# Patient Record
Sex: Female | Born: 1960 | Race: White | Hispanic: No | Marital: Single | Smoking: Never smoker
Health system: Southern US, Community
[De-identification: ages and names within clinical notes are randomized; demographics above are authoritative.]

## PROBLEM LIST (undated history)

## (undated) DIAGNOSIS — C539 Malignant neoplasm of cervix uteri, unspecified: Secondary | ICD-10-CM

## (undated) DIAGNOSIS — R5383 Other fatigue: Secondary | ICD-10-CM

## (undated) DIAGNOSIS — Z86718 Personal history of other venous thrombosis and embolism: Secondary | ICD-10-CM

## (undated) DIAGNOSIS — D649 Anemia, unspecified: Secondary | ICD-10-CM

## (undated) DIAGNOSIS — R896 Abnormal cytological findings in specimens from other organs, systems and tissues: Secondary | ICD-10-CM

## (undated) DIAGNOSIS — D6851 Activated protein C resistance: Secondary | ICD-10-CM

## (undated) DIAGNOSIS — T4145XA Adverse effect of unspecified anesthetic, initial encounter: Secondary | ICD-10-CM

## (undated) DIAGNOSIS — T8859XA Other complications of anesthesia, initial encounter: Secondary | ICD-10-CM

## (undated) DIAGNOSIS — E039 Hypothyroidism, unspecified: Secondary | ICD-10-CM

## (undated) DIAGNOSIS — Z86711 Personal history of pulmonary embolism: Secondary | ICD-10-CM

## (undated) DIAGNOSIS — N83209 Unspecified ovarian cyst, unspecified side: Secondary | ICD-10-CM

## (undated) DIAGNOSIS — Z22322 Carrier or suspected carrier of Methicillin resistant Staphylococcus aureus: Secondary | ICD-10-CM

## (undated) DIAGNOSIS — IMO0001 Reserved for inherently not codable concepts without codable children: Secondary | ICD-10-CM

## (undated) DIAGNOSIS — F419 Anxiety disorder, unspecified: Secondary | ICD-10-CM

## (undated) DIAGNOSIS — Z95828 Presence of other vascular implants and grafts: Secondary | ICD-10-CM

## (undated) DIAGNOSIS — Z923 Personal history of irradiation: Secondary | ICD-10-CM

## (undated) HISTORY — DX: Activated protein C resistance: D68.51

## (undated) HISTORY — DX: Abnormal cytological findings in specimens from other organs, systems and tissues: R89.6

## (undated) HISTORY — DX: Personal history of irradiation: Z92.3

## (undated) HISTORY — DX: Personal history of other venous thrombosis and embolism: Z86.718

## (undated) HISTORY — DX: Hypothyroidism, unspecified: E03.9

## (undated) HISTORY — DX: Reserved for inherently not codable concepts without codable children: IMO0001

## (undated) HISTORY — DX: Personal history of pulmonary embolism: Z86.711

## (undated) HISTORY — DX: Anxiety disorder, unspecified: F41.9

## (undated) HISTORY — DX: Unspecified ovarian cyst, unspecified side: N83.209

---

## 1998-07-15 ENCOUNTER — Inpatient Hospital Stay (HOSPITAL_COMMUNITY): Admission: AD | Admit: 1998-07-15 | Discharge: 1998-07-18 | Payer: Self-pay | Admitting: Psychiatry

## 2000-11-19 ENCOUNTER — Other Ambulatory Visit: Admission: RE | Admit: 2000-11-19 | Discharge: 2000-11-19 | Payer: Self-pay | Admitting: Family Medicine

## 2002-05-26 ENCOUNTER — Other Ambulatory Visit: Admission: RE | Admit: 2002-05-26 | Discharge: 2002-05-26 | Payer: Self-pay | Admitting: Family Medicine

## 2004-04-16 ENCOUNTER — Ambulatory Visit: Payer: Self-pay | Admitting: Family Medicine

## 2004-07-01 ENCOUNTER — Ambulatory Visit: Payer: Self-pay | Admitting: Family Medicine

## 2004-08-07 ENCOUNTER — Ambulatory Visit: Payer: Self-pay | Admitting: Family Medicine

## 2005-01-05 ENCOUNTER — Ambulatory Visit: Payer: Self-pay | Admitting: Family Medicine

## 2005-01-07 ENCOUNTER — Ambulatory Visit: Payer: Self-pay | Admitting: Family Medicine

## 2005-03-24 ENCOUNTER — Ambulatory Visit: Payer: Self-pay | Admitting: Family Medicine

## 2005-03-31 ENCOUNTER — Ambulatory Visit: Payer: Self-pay | Admitting: Family Medicine

## 2005-06-05 ENCOUNTER — Ambulatory Visit: Payer: Self-pay | Admitting: Family Medicine

## 2005-06-10 ENCOUNTER — Ambulatory Visit: Payer: Self-pay | Admitting: Family Medicine

## 2006-03-26 ENCOUNTER — Emergency Department (HOSPITAL_COMMUNITY): Admission: EM | Admit: 2006-03-26 | Discharge: 2006-03-26 | Payer: Self-pay | Admitting: Emergency Medicine

## 2007-04-28 HISTORY — PX: CERVICAL CONIZATION W/BX: SHX1330

## 2007-07-07 ENCOUNTER — Emergency Department (HOSPITAL_COMMUNITY): Admission: EM | Admit: 2007-07-07 | Discharge: 2007-07-07 | Payer: Self-pay | Admitting: Emergency Medicine

## 2009-10-30 ENCOUNTER — Inpatient Hospital Stay (HOSPITAL_COMMUNITY): Admission: AD | Admit: 2009-10-30 | Discharge: 2009-10-30 | Payer: Self-pay | Admitting: Obstetrics & Gynecology

## 2009-10-30 ENCOUNTER — Ambulatory Visit: Payer: Self-pay | Admitting: Obstetrics and Gynecology

## 2009-11-27 ENCOUNTER — Ambulatory Visit: Payer: Self-pay | Admitting: Obstetrics and Gynecology

## 2009-11-27 ENCOUNTER — Encounter: Payer: Self-pay | Admitting: Obstetrics and Gynecology

## 2009-11-27 LAB — CONVERTED CEMR LAB
Free T4: 0.97 ng/dL (ref 0.80–1.80)
Pap Smear: UNDETERMINED
T3, Free: 2.2 pg/mL — ABNORMAL LOW (ref 2.3–4.2)
TSH: 1.578 microintl units/mL (ref 0.350–4.500)

## 2010-01-23 ENCOUNTER — Ambulatory Visit: Payer: Self-pay | Admitting: Obstetrics and Gynecology

## 2010-01-23 ENCOUNTER — Ambulatory Visit (HOSPITAL_COMMUNITY): Admission: RE | Admit: 2010-01-23 | Discharge: 2010-01-23 | Payer: Self-pay | Admitting: Anesthesiology

## 2010-01-23 ENCOUNTER — Other Ambulatory Visit: Admission: RE | Admit: 2010-01-23 | Discharge: 2010-01-23 | Payer: Self-pay | Admitting: Obstetrics and Gynecology

## 2010-07-13 LAB — CBC
MCHC: 35.1 g/dL (ref 30.0–36.0)
WBC: 4.9 10*3/uL (ref 4.0–10.5)

## 2010-07-13 LAB — COMPREHENSIVE METABOLIC PANEL
ALT: 13 U/L (ref 0–35)
AST: 15 U/L (ref 0–37)
Albumin: 4 g/dL (ref 3.5–5.2)
Chloride: 103 mEq/L (ref 96–112)
Creatinine, Ser: 0.74 mg/dL (ref 0.4–1.2)
GFR calc Af Amer: >60 mL/min (ref >60)
Sodium: 135 mEq/L (ref 135–145)
Total Bilirubin: 0.6 mg/dL (ref 0.3–1.2)

## 2010-07-13 LAB — URINALYSIS, ROUTINE W REFLEX MICROSCOPIC
Bilirubin Urine: NEGATIVE
Hgb urine dipstick: NEGATIVE
Ketones, ur: NEGATIVE mg/dL
Protein, ur: NEGATIVE mg/dL

## 2010-07-13 LAB — GC/CHLAMYDIA PROBE AMP, GENITAL: Chlamydia, DNA Probe: NEGATIVE

## 2010-07-13 LAB — WET PREP, GENITAL

## 2010-07-24 ENCOUNTER — Other Ambulatory Visit: Payer: Self-pay | Admitting: Family Medicine

## 2010-07-24 ENCOUNTER — Other Ambulatory Visit: Payer: Self-pay | Admitting: Obstetrics and Gynecology

## 2010-07-24 ENCOUNTER — Ambulatory Visit: Payer: Self-pay | Admitting: Obstetrics and Gynecology

## 2010-07-24 DIAGNOSIS — F449 Dissociative and conversion disorder, unspecified: Secondary | ICD-10-CM

## 2010-07-24 DIAGNOSIS — N949 Unspecified condition associated with female genital organs and menstrual cycle: Secondary | ICD-10-CM

## 2010-07-24 DIAGNOSIS — R300 Dysuria: Secondary | ICD-10-CM

## 2010-07-24 DIAGNOSIS — R102 Pelvic and perineal pain: Secondary | ICD-10-CM

## 2010-07-24 DIAGNOSIS — N83209 Unspecified ovarian cyst, unspecified side: Secondary | ICD-10-CM

## 2010-07-24 LAB — POCT URINALYSIS DIP (DEVICE)
Bilirubin Urine: NEGATIVE
Ketones, ur: NEGATIVE mg/dL
Protein, ur: NEGATIVE mg/dL
Urobilinogen, UA: 0.2 mg/dL (ref 0.0–1.0)
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
## 2010-07-31 ENCOUNTER — Ambulatory Visit (HOSPITAL_COMMUNITY): Payer: Self-pay

## 2010-08-03 ENCOUNTER — Inpatient Hospital Stay (HOSPITAL_COMMUNITY)
Admission: AD | Admit: 2010-08-03 | Discharge: 2010-08-03 | Disposition: A | Discharge status: Home / Self Care | Payer: Self-pay | Source: Ambulatory Visit | Attending: Obstetrics & Gynecology | Admitting: Obstetrics & Gynecology

## 2010-08-03 DIAGNOSIS — R300 Dysuria: Secondary | ICD-10-CM | POA: Insufficient documentation

## 2010-08-03 DIAGNOSIS — R350 Frequency of micturition: Secondary | ICD-10-CM | POA: Insufficient documentation

## 2010-08-03 DIAGNOSIS — O211 Hyperemesis gravidarum with metabolic disturbance: Secondary | ICD-10-CM

## 2010-08-03 LAB — DIFFERENTIAL
Basophils Relative: 0 % (ref 0–1)
Monocytes Absolute: 0.6 10*3/uL (ref 0.1–1.0)
Monocytes Relative: 14 % — ABNORMAL HIGH (ref 3–12)
Neutro Abs: 2.2 10*3/uL (ref 1.7–7.7)

## 2010-08-03 LAB — URINALYSIS, ROUTINE W REFLEX MICROSCOPIC
Glucose, UA: NEGATIVE mg/dL
Hgb urine dipstick: NEGATIVE
Ketones, ur: 15 mg/dL — AB
pH: 6 (ref 5.0–8.0)

## 2010-08-03 LAB — URINE MICROSCOPIC-ADD ON

## 2010-08-03 LAB — CBC
HCT: 38.3 % (ref 36.0–46.0)
MCH: 31.8 pg (ref 26.0–34.0)
MCHC: 35.2 g/dL (ref 30.0–36.0)
Platelets: 144 10*3/uL — ABNORMAL LOW (ref 150–400)
RBC: 4.25 MIL/uL (ref 3.87–5.11)
RDW: 11.6 % (ref 11.5–15.5)
WBC: 4.3 10*3/uL (ref 4.0–10.5)

## 2010-08-05 LAB — URINE CULTURE

## 2010-08-06 ENCOUNTER — Ambulatory Visit (HOSPITAL_COMMUNITY)
Admission: RE | Admit: 2010-08-06 | Discharge: 2010-08-06 | Disposition: A | Discharge status: Home / Self Care | Payer: Self-pay | Source: Ambulatory Visit | Attending: Family Medicine | Admitting: Family Medicine

## 2010-08-06 DIAGNOSIS — N83209 Unspecified ovarian cyst, unspecified side: Secondary | ICD-10-CM

## 2010-08-06 DIAGNOSIS — R102 Pelvic and perineal pain: Secondary | ICD-10-CM

## 2010-08-06 DIAGNOSIS — N949 Unspecified condition associated with female genital organs and menstrual cycle: Secondary | ICD-10-CM | POA: Insufficient documentation

## 2010-08-06 DIAGNOSIS — D252 Subserosal leiomyoma of uterus: Secondary | ICD-10-CM | POA: Insufficient documentation

## 2010-08-07 ENCOUNTER — Ambulatory Visit: Payer: Self-pay | Admitting: Obstetrics and Gynecology

## 2010-08-21 ENCOUNTER — Ambulatory Visit: Payer: Self-pay | Admitting: Obstetrics and Gynecology

## 2010-08-27 ENCOUNTER — Ambulatory Visit: Payer: Self-pay | Admitting: Obstetrics & Gynecology

## 2010-09-09 NOTE — Assessment & Plan Note (Signed)
NAMECRUZITA, LIPA NO.:  [id number]   MEDICAL RECORD NO.:  [id number]          PATIENT TYPE:  WOC   LOCATION:  [facility]         FACILITY:  [facility]   PHYSICIAN:  Caren Griffins, CNM       DATE OF BIRTH:  [birth year]   DATE OF SERVICE:  11/27/2009                                  CLINIC NOTE   REASON FOR VISIT:  Referral from [facility] due to abdominal pain, amenorrhea,  and history of abnormal Paps.   HISTORY OF PRESENT ILLNESS:  This is a 50 year old nulliparous Caucasian  female who was seen at [facility] on October 30, 2009, by me.  On that day,  she was concerned with having 3 months of amenorrhea after having always  regular monthly cycles, was also having constipation at that time and in  addition was having a several-day history of right lower quadrant  abdominal pain.  Since that visit, she did have spontaneous onset of  menses on October 27, 2009, and had a normal flow and she did do the  treatments for constipation and has had normal bowel movements.  Today,  she denies any abdominal pain; however, she does report a history of  urinary frequency and nocturia that has been going on for a long time.  Her chief concern is that she states she had severe dysplasia on her  last colposcopy, cervical biopsy that she says was done May 08, 2009, with Dr. Senaida Ores in [place].  She can no longer go there due  to change in insurance status.  She is also giving a history of having  had a cervical conization for abnormal cytology and states that  following that she had normal Pap smears x3.  She is not sexually active  and has no abnormal bleeding.   ALLERGIES:  None.   MEDICATIONS:  Paxil, clonazepam, Synthroid, folic acid, Metamucil, and  ibuprofen.   HEALTH CARE MAINTENANCE:  She has had the usual immunizations and she  did have a normal mammogram 2 years ago, a colonoscopy 5 years ago that  was normal, and she has regular dental care.   MENSTRUAL HISTORY:   12 x 28 x 3-4 days, moderate flow, and mild  dysmenorrhea.  She states that she has had spotting in between periods  in the past but not recently and of note, as above she did have 3 months  of amenorrhea prior to her normal menses October 27, 2009.  Contraception,  she is not to be on any estrogens and was taken off pills when she had  her pulmonary embolism.  She is not in a relationship at present and  does not request birth control; however, she did have sexual relations 4  months ago.   OBSTETRICAL HISTORY:  Nulliparous.   GYNECOLOGIC HISTORY:  Abnormal Paps as above.  She states she has had a  cone procedure and she thinks a LEEP procedure.  Most recent evaluation  was May 08, 2009, and she states colposcopy showed severe dysplasia.  She does have known fibroids.  She denies any history of STDs.   PAST MEDICAL HISTORY:  Significant for panic  attacks which are  controlled on Paxil, hypothyroidism stating she had a goiter treated in  the past and has not had her thyroid function test done in a long time.  She states she had 3 pulmonary emboli in the past and was hospitalized  for 1 week and states that she has bad genes from both mother and father  that contributes to her blood clotting.   SURGICAL HISTORY:  She had never had transfusion.  Her surgeries include  shoulder cyst removal, cervical conization, exploratory laparoscopy  which was done for dysmenorrhea.  She states she had scar tissue and her  ovary was adherent to intestines.   SOCIAL HISTORY:  She works as a Child psychotherapist, lives alone, nonsmoker.  Does  not drink alcohol.  Denies any illicit drug use or history of being  abused.  She has had 1 sexual partner in the past year.   FAMILY HISTORY:  Father, grandmother, and brother with diabetes.  Father  with heart disease.  Mother and father with hypertension.  No history of  breast, colon, ovarian, or uterine cancer.  Mother had blood clots in  legs and stroke.   REVIEW  OF SYSTEMS:  Positive for intermittent abdominal pain but she is  not having pain today after having had a bowel movement and as noted  above, she does have frequent urination but no urinary incontinence or  dysuria, never had gross hematuria.  She denies any irritative vaginal  discharge or dysmenorrhea.   OBJECTIVE:  VITAL SIGNS:  97.3, 91, 108/77, weight 151.9, and height 6  feet 5 inches.  GENERAL:  WN/WD, pleasant in NAD.  HEENT:  Normocephalic.  Good dentition.  NECK:  Thyroid is slightly enlarged and smooth.  No discrete mass.  HEART:  RRR without murmur.  LUNGS:  CTA bilateral.  BREASTS:  Pendulous.  No discrete masses appreciated.  They appear  symmetric.  There is no nipple discharge or skin lesions.  No  lymphadenopathy noted.  ABDOMEN:  Soft, essentially nontender.  EXTREMITIES:  Legs pulses full and equal.  No dependent edema.  PELVIC:  NEFG.  Good tone and support.  Vagina rugated.  Cervix  posterior.  There is a small nodule on the anterior lip but it appears  otherwise clean.  Bimanual posterior and short consistent with having  had conization.  Uterus is normal to upper limits of normal size, normal  contour and mobile.  No adnexal tenderness or masses appreciated.   LABORATORY DATA:  She had a normal CMP, wet prep, CBC, and urinalysis as  well as a negative pregnancy test at her visit in [facility] on October 30, 2009.  She had an ultrasound same day which revealed a tiny right ovarian cyst  and a 3.9 cm soft tissue mass adjacent to anterior aspect of the uterus  which was thought to be an exophytic uterine fibroid.   ASSESSMENT:  1. History of abnormal cervical cytology.  2. Hypothyroidism.  3. History of pulmonary embolus.  4. Chronic anxiety.   PLAN:  Labs today will be thyroid function tests and Pap smear is done.  In addition, we will send a release of information for Dr. Berenda Morale  records as she tells [place] that he has information about her medical  illnesses as  well as her abnormal cervical cytology.  Followup will be  in 1 month.           ______________________________  Caren Griffins, CNM     DP/MEDQ  D:  11/27/2009  T:  11/28/2009  Job:  [id number]

## 2010-09-10 ENCOUNTER — Ambulatory Visit: Payer: Self-pay | Admitting: Physician Assistant

## 2010-09-27 ENCOUNTER — Emergency Department (HOSPITAL_COMMUNITY)
Admission: EM | Admit: 2010-09-27 | Discharge: 2010-09-27 | Discharge status: Against Medical Advice | Payer: Self-pay | Attending: Emergency Medicine | Admitting: Emergency Medicine

## 2010-09-27 DIAGNOSIS — R4589 Other symptoms and signs involving emotional state: Secondary | ICD-10-CM | POA: Insufficient documentation

## 2010-09-27 DIAGNOSIS — R079 Chest pain, unspecified: Secondary | ICD-10-CM | POA: Insufficient documentation

## 2010-10-24 ENCOUNTER — Ambulatory Visit: Payer: Self-pay | Admitting: Obstetrics & Gynecology

## 2010-11-26 ENCOUNTER — Encounter: Payer: Self-pay | Admitting: *Deleted

## 2010-11-26 DIAGNOSIS — F419 Anxiety disorder, unspecified: Secondary | ICD-10-CM

## 2010-11-26 DIAGNOSIS — D219 Benign neoplasm of connective and other soft tissue, unspecified: Secondary | ICD-10-CM

## 2010-11-26 DIAGNOSIS — Z86711 Personal history of pulmonary embolism: Secondary | ICD-10-CM | POA: Insufficient documentation

## 2010-11-26 DIAGNOSIS — E039 Hypothyroidism, unspecified: Secondary | ICD-10-CM | POA: Insufficient documentation

## 2010-12-10 ENCOUNTER — Ambulatory Visit: Payer: Self-pay | Admitting: Obstetrics & Gynecology

## 2011-01-19 LAB — POCT I-STAT CREATININE
Creatinine, Ser: 0.9
Operator id: 285841

## 2011-01-19 LAB — DIFFERENTIAL
Basophils Absolute: 0
Lymphocytes Relative: 41
Lymphs Abs: 1.8
Monocytes Relative: 8
Neutro Abs: 2.2
Neutrophils Relative %: 49

## 2011-01-19 LAB — I-STAT 8, (EC8 V) (CONVERTED LAB)
BUN: 9
Chloride: 105
Hemoglobin: 13.9
Operator id: 285841
pCO2, Ven: 39.6 — ABNORMAL LOW

## 2011-01-19 LAB — POCT CARDIAC MARKERS
CKMB, poc: <1 — ABNORMAL LOW
Myoglobin, poc: 76.5
Operator id: 285841
Troponin i, poc: <0.05

## 2011-01-19 LAB — D-DIMER, QUANTITATIVE: D-Dimer, Quant: 1.11 — ABNORMAL HIGH

## 2011-01-19 LAB — CBC
MCV: 89.2
Platelets: 228
RDW: 12.7
WBC: 4.5

## 2011-03-13 ENCOUNTER — Ambulatory Visit: Payer: Self-pay | Admitting: Obstetrics & Gynecology

## 2013-03-17 HISTORY — PX: CERVICAL BIOPSY: SHX590

## 2013-03-28 ENCOUNTER — Other Ambulatory Visit: Payer: Self-pay | Admitting: Gynecologic Oncology

## 2013-04-05 ENCOUNTER — Encounter: Payer: Self-pay | Admitting: Gynecologic Oncology

## 2013-04-06 ENCOUNTER — Encounter: Payer: Self-pay | Admitting: Gynecologic Oncology

## 2013-04-06 ENCOUNTER — Encounter (INDEPENDENT_AMBULATORY_CARE_PROVIDER_SITE_OTHER): Payer: Self-pay

## 2013-04-06 ENCOUNTER — Ambulatory Visit: Payer: Managed Care, Other (non HMO) | Attending: Gynecologic Oncology | Admitting: Gynecologic Oncology

## 2013-04-06 VITALS — BP 111/62 | HR 92 | Temp 98.5°F | Resp 16 | Ht 64.17 in | Wt 139.2 lb

## 2013-04-06 DIAGNOSIS — N888 Other specified noninflammatory disorders of cervix uteri: Secondary | ICD-10-CM

## 2013-04-06 DIAGNOSIS — C539 Malignant neoplasm of cervix uteri, unspecified: Secondary | ICD-10-CM | POA: Insufficient documentation

## 2013-04-06 NOTE — Addendum Note (Signed)
Addended by: Warner Mccreedy D on: 04/06/2013 04:00 PM   Modules accepted: Orders

## 2013-04-06 NOTE — Progress Notes (Signed)
Consult Note: Gyn-Onc  Consult was requested by Dr. Senaida Ores for the evaluation of Tammie Marsh 52 y.o. female  CC:  Chief Complaint  Patient presents with  . Cervical mass    New Consult    Assessment/Plan:  Tammie Marsh  is a 52 y.o.  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 A biopsy was collected in 03/18/2011 at [facility] indicated at least severe dysplasia squamous cell carcinoma in situ. Review of the specimen at [facility] states that this specimen is suspicious for invasive squamous cell carcinoma. On physical examination this is a leasing 7.5 cm cervix with extension to bilateral parametrial. The cervix appears barrel shaped and several additional biopsies were collected.  I hope the specimens can  definitively represent invasive squamous cell cancer.  If not consideration will have to be given for a large excision of the lesion with pathology present.  The CT scan demonstrates possible left iliac lymph node involvement. A PET scan has been ordered to more clearly assess the extent of disease.  We will send a urine specimen for cytology. Referrals been made to Dr. Janyth Contes and Dr. Darrold Span  for definitive chemoradiation therapy treatment.  The patient's questions were answered to her satisfaction.   Thank you for the referral.   HPI: Tammie Marsh  is a 52 y.o. nulliparous female last normal menstrual period approximately 2 years ago. She reports over the last year she's had severe abdominal cramping with intermittent episodes of heavy bleeding. She presented to emergency room on 03/13/2013 with heavy vaginal bleeding. A CT scan of the abdomen and pelvis was notable for a mass expanding the cervix and lower uterine segment measuring 7.9 x 4.7 cm x 3 cm a solid right adnexal mass measuring 5.5 cm in size was also noted this was thought to reflect a pedunculated fibroid. There is a single enhancing left  external iliac lymph node measuring 14 mm in the short axis. Patient was taken to the operating room on 03/17/2013 at which time there was a biopsy performed of the cervical mass and or aborted hysteroscopy D&C. Pathology was consistent with squamous mucosa with high-grade squamous dysplasia suspicious for invasive squamous cell carcinoma.  Tammie Marsh history is notable for a long history of abnormal Pap tests and previous treatments with cryotherapy and cold knife cone biopsy. The last cervical excision was in 2009. She states that her last abnormal Pap test was 2 years ago.  Tammie Marsh reports poor appetite she denies nausea or vomiting.  She reports lower abdominal crampy pain managed with hydrocodone. She also reports intermittent diarrhea and no change in her weight.  Current Meds:  Outpatient Encounter Prescriptions as of 04/06/2013  Medication Sig  . Biotin 10 MG TABS Take by mouth.  . cholecalciferol (VITAMIN D) 1000 UNITS tablet Take 1,000 Units by mouth daily.  . clonazePAM (KLONOPIN) 2 MG tablet Take 2 mg by mouth 2 (two) times daily.   . cyanocobalamin 1000 MCG tablet Take 100 mcg by mouth daily.  Marland Kitchen HYDROcodone-acetaminophen (NORCO/VICODIN) 5-325 MG per tablet Take 1 tablet by mouth every 6 (six) hours as needed for moderate pain.  Marland Kitchen ibuprofen (ADVIL,MOTRIN) 800 MG tablet   . PARoxetine (PAXIL) 30 MG tablet Take 30 mg by mouth daily.  . traMADol (ULTRAM) 50 MG tablet Take 50-100 mg by mouth every 6 (six) hours as needed.  . vitamin E 400 UNIT capsule  Take 400 Units by mouth daily.  Marland Kitchen levothyroxine (SYNTHROID, LEVOTHROID) 112 MCG tablet Take 112 mcg by mouth daily before breakfast.    Allergy: No Known Allergies  Social Hx:   History   Social History  . Marital Status: Single    Spouse Name: N/A    Number of Children: N/A  . Years of Education: N/A   Occupational History  . Not on file.   Social History Main Topics  . Smoking status: Never Smoker   . Smokeless tobacco:  Not on file  . Alcohol Use: No  . Drug Use: No  . Sexual Activity: No   Other Topics Concern  . Not on file   Social History Narrative  . No narrative on file    Past Surgical Hx: History reviewed. No pertinent past surgical history.  Past Medical Hx:  Past Medical History  Diagnosis Date  . Abnormal Pap smear 11/27/2009    ASC-US  . Factor V Leiden mutation   . Anxiety   . Hypothyroidism   . Irregular periods   . ASCUS on Pap smear   . Ovarian cyst     Past Gynecological History: Gravida 0 menarche at 76 with regular menses.  Long standing history of abnormal Pap test with a report of several episodes of cryotherapy. Reports cold knife cone biopsy in 2009. Last abnormal Pap test per the patient was 2 years ago. No LMP recorded.  Family Hx:  Family History  Problem Relation Age of Onset  . Stroke Mother     Review of Systems: Constitutional  Feels well Cardiovascular  No chest pain, shortness of breath, or edema  Pulmonary  No cough or wheeze.  Gastro Intestinal  No nausea, vomitting, or diarrhoea. No bright red blood per rectum, no abdominal pain, change in bowel movement, or constipation.  Genito Urinary  No frequency, urgency, dysuria, daily vaginal bleeding. Musculo Skeletal  No myalgia, arthralgia, joint swelling or pain  Neurologic  No weakness, numbness, change in gait,  Psychology  Long standing depression   Vitals:  Blood pressure 111/62, pulse 92, temperature 98.5 F (36.9 C), temperature source Oral, resp. rate 16, height 5' 4.17" (1.63 m), weight 139 lb 3.2 oz (63.141 kg). Physical Exam: WD in NAD Neck  Supple NROM, without any enlargements.  Lymph Node Survey No cervical supraclavicular or inguinal adenopathy Cardiovascular  Pulse normal rate, regularity and rhythm. S1 and S2 normal.  Lungs  Clear to auscultation bilateraly, Good air movement.  Psychiatry  Alert and oriented to person, place, and time, flat affect, slow response,  appropriate judgement, slow speech.  Abdomen  Normoactive bowel sounds, abdomen soft, non-tender. No CVA tenderness Genito Urinary  Vulva/vagina: Normal external female genitalia.  No lesions. No discharge or bleeding.  Bladder/urethra:  No lesions or masses  Vagina: Estrogenized, no vaginal mets.  Cervix: barrel shaped, 7.5cm  Uterus: 8cm mobile, bilateral parametrial involvement or nodularity.  Adnexa: No palpable masses. Rectal  Good tone, no masses bilateral parametrial involvement Extremities  No bilateral cyanosis, clubbing or edema.   Laurette Schimke, MD, PhD 04/06/2013, 3:30 PM

## 2013-04-06 NOTE — Patient Instructions (Signed)
Plan to have the PET scan at [facility].  We will contact you with the results and with your appointment times with a Radiation Oncologist and a Medical Oncologist.

## 2013-04-07 ENCOUNTER — Telehealth: Payer: Self-pay | Admitting: Oncology

## 2013-04-07 NOTE — Telephone Encounter (Signed)
S/W PT AND GVE NP APPT 12/22 @ 10:30 W/DR. LIVESAY REFERRING - MELISSA CROSS DX- CERVICAL MASS WELCOME PACKET MAILED.

## 2013-04-10 ENCOUNTER — Telehealth: Payer: Self-pay | Admitting: Oncology

## 2013-04-10 NOTE — Telephone Encounter (Signed)
C/D 04/10/13 for appt. 04/17/13

## 2013-04-11 ENCOUNTER — Encounter: Payer: Self-pay | Admitting: Radiation Oncology

## 2013-04-11 ENCOUNTER — Other Ambulatory Visit: Payer: Self-pay

## 2013-04-11 ENCOUNTER — Ambulatory Visit
Admission: RE | Admit: 2013-04-11 | Discharge: 2013-04-11 | Disposition: A | Discharge status: Home / Self Care | Payer: Managed Care, Other (non HMO) | Source: Ambulatory Visit | Attending: Radiation Oncology | Admitting: Radiation Oncology

## 2013-04-11 ENCOUNTER — Ambulatory Visit
Admission: RE | Admit: 2013-04-11 | Discharge: 2013-04-11 | Disposition: A | Discharge status: Home / Self Care | Payer: Managed Care, Other (non HMO) | Source: Ambulatory Visit | Attending: Gynecologic Oncology | Admitting: Gynecologic Oncology

## 2013-04-11 ENCOUNTER — Ambulatory Visit: Payer: Managed Care, Other (non HMO)

## 2013-04-11 ENCOUNTER — Telehealth: Payer: Self-pay | Admitting: Oncology

## 2013-04-11 ENCOUNTER — Ambulatory Visit
Admission: RE | Admit: 2013-04-11 | Payer: Managed Care, Other (non HMO) | Source: Ambulatory Visit | Admitting: Radiation Oncology

## 2013-04-11 VITALS — BP 116/73 | HR 85 | Temp 98.1°F | Resp 20 | Ht 65.0 in | Wt 136.8 lb

## 2013-04-11 DIAGNOSIS — Z79899 Other long term (current) drug therapy: Secondary | ICD-10-CM | POA: Insufficient documentation

## 2013-04-11 DIAGNOSIS — Z510 Encounter for antineoplastic radiation therapy: Secondary | ICD-10-CM | POA: Insufficient documentation

## 2013-04-11 DIAGNOSIS — F411 Generalized anxiety disorder: Secondary | ICD-10-CM | POA: Insufficient documentation

## 2013-04-11 DIAGNOSIS — C539 Malignant neoplasm of cervix uteri, unspecified: Secondary | ICD-10-CM | POA: Insufficient documentation

## 2013-04-11 DIAGNOSIS — D6859 Other primary thrombophilia: Secondary | ICD-10-CM | POA: Insufficient documentation

## 2013-04-11 DIAGNOSIS — E039 Hypothyroidism, unspecified: Secondary | ICD-10-CM | POA: Insufficient documentation

## 2013-04-11 DIAGNOSIS — C530 Malignant neoplasm of endocervix: Secondary | ICD-10-CM

## 2013-04-11 NOTE — Progress Notes (Signed)
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 female who is seen out courtesy of Dr. Laurette Schimke for an opinion concerning radiation therapy as part of management of patient's recently diagnosed locally advanced cervical cancer.  She is a nulliparous female last normal menstrual period approximately 2 years ago. She reports over the last year she's had severe abdominal cramping with intermittent episodes of heavy bleeding. She presented to emergency room on 03/13/2013 with heavy vaginal bleeding. A CT scan of the abdomen and pelvis was notable for a mass expanding the cervix and lower uterine segment measuring 7.9 x 4.7 cm x 3 cm,  a solid right adnexal mass measuring 5.5 cm in size was also noted this was thought to reflect a pedunculated fibroid. There is a single enhancing left external iliac lymph node measuring 14 mm in the short axis. Patient was taken to the operating room on 03/17/2013 at which time there was a biopsy performed of the cervical mass and or aborted hysteroscopy D&C. Pathology was consistent with squamous mucosa with high-grade squamous dysplasia suspicious for invasive squamous cell carcinoma. Tammie Marsh history is notable for a long history of abnormal Pap tests and previous treatments with cryotherapy and cold knife cone biopsy. The last cervical excision was in 2009. She states that her last abnormal Pap test was 2 years ago. His cervical biopsy by Dr. Nelly Rout on 04/06/2013 did show invasive poorly differentiated squamous cell carcinoma. The patient was originally scheduled  to be seen by medical oncology and radiation oncology next week however yesterday the patient presented to the emergency room at [facility] with severe vaginal bleeding. In light of these issue the patient is now seen urgently in radiation oncology.  PREVIOUS RADIATION THERAPY: No  PAST MEDICAL HISTORY:  has a past medical history of Abnormal Pap smear (11/27/2009); Factor V Leiden mutation; Anxiety; Hypothyroidism; Irregular periods; ASCUS on Pap smear; and Ovarian cyst.    PAST SURGICAL HISTORY: Past Surgical History  Procedure Laterality Date  . Cervical biopsy  03/17/2013    FAMILY HISTORY: family history includes Stroke in her mother.  SOCIAL HISTORY:  reports that she has never smoked. She has never used smokeless tobacco. She reports that she does not drink alcohol or use illicit drugs.  ALLERGIES: Review of patient's allergies indicates no known allergies.  MEDICATIONS:  Current Outpatient Prescriptions  Medication Sig Dispense Refill  . Biotin 10 MG TABS Take 10 mg by mouth daily.       . cholecalciferol (VITAMIN D) 1000 UNITS tablet Take 1,000 Units by mouth daily.      . clonazePAM (KLONOPIN) 2 MG tablet Take 2 mg by mouth 2 (two) times daily.       . cyanocobalamin 1000 MCG tablet Take 100 mcg by mouth daily.      Marland Kitchen HYDROcodone-acetaminophen (NORCO/VICODIN) 5-325 MG per tablet Take 1 tablet by mouth every 6 (six) hours as needed for moderate pain.      Marland Kitchen ibuprofen (ADVIL,MOTRIN) 800 MG tablet       . levothyroxine (SYNTHROID, LEVOTHROID) 112 MCG tablet Take 112 mcg by mouth  daily before breakfast.      . vitamin E 400 UNIT capsule Take 400 Units by mouth daily.      Marland Kitchen PARoxetine (PAXIL) 30 MG tablet Take 60 mg by mouth daily.        No current facility-administered medications for this encounter.    REVIEW OF SYSTEMS:  A 15 point review of systems is documented in the electronic medical record. This was obtained by the nursing staff. However, I reviewed this with  the patient to discuss relevant findings and make appropriate changes.  Patient does report a poor appetite at this time. She denies any nausea or emesis does complain of pain in the suprapubic area as well as lower abdominal crampy pain. she has had some intermittent diarrhea. she denies any hematuria or rectal bleeding.   PHYSICAL EXAM:  height is 5\' 5"  (1.651 m) and weight is 136 lb 12.8 oz (62.052 kg). Her oral temperature is 98.1 F (36.7 C). Her blood pressure is 116/73 and her pulse is 85. Her respiration is 20.   BP 116/73  Pulse 85  Temp(Src) 98.1 F (36.7 C) (Oral)  Resp 20  Ht 5\' 5"  (1.651 m)  Wt 136 lb 12.8 oz (62.052 kg)  BMI 22.76 kg/m2  General Appearance:    Alert, cooperative, no distress, appears stated age  Head:    Normocephalic, without obvious abnormality, atraumatic  Eyes:    PERRL, conjunctiva/corneas clear, EOM's intact     Ears:    Normal TM's and external ear canals, both ears  Nose:   Nares normal, septum midline, mucosa normal, no drainage    or sinus tenderness  Throat:   Lips, mucosa, and tongue normal; teeth and gums normal  Neck:   Supple, symmetrical, trachea midline, no adenopathy;    thyroid:  no enlargement/tenderness/nodules; no carotid   bruit or JVD  Back:     Symmetric, no curvature, ROM normal, no CVA tenderness  Lungs:     Clear to auscultation bilaterally, respirations unlabored  Chest Wall:    No tenderness or deformity   Heart:    Regular rate and rhythm, S1 and S2 normal, no murmur, rub   or gallop     Abdomen:     Soft, non-tender, bowel sounds active all four quadrants,    no masses, no organomegaly  Genitalia:    Normal external genitalia, on speculum examination there is no bleeding noted in the vaginal vault.  the cervix area has biopsy changes with no active bleeding.  on bimanual examination the cervix was expanded to approximately 6 cm and quite firm with palpation. The cervix protrudes approximately 1/3 the way down the vaginal  vault. The vaginal fornices appear intact.  It is difficult to determine parametrial involvement. No obvious sidewall involvement   Rectal:    Normal tone, confirms bimanual exam    Extremities:   Extremities normal, atraumatic, no cyanosis or edema  Pulses:   2+ and symmetric all extremities  Skin:   Skin color, texture, turgor normal, no rashes or lesions  Lymph nodes:   Cervical, supraclavicular, and axillary nodes normal  Neurologic:   CNII-XII intact, normal strength, sensation and reflexes    throughout       ECOG = 1  1 - Symptomatic but completely ambulatory (Restricted in physically strenuous activity but ambulatory and able to carry out work of a light or sedentary nature. For example, light housework, office work)    LABORATORY DATA: pending   RADIOGRAPHY: A  CT scan of the abdomen and pelvis was notable for a mass expanding the cervix and lower uterine segment measuring 7.9 x 4.7 cm x 3 cm a solid right adnexal mass measuring 5.5 cm in size was also noted this was thought to reflect a pedunculated fibroid. 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 The patient would be a good candidate for a definitive course of radiation along with radiosensitizing chemotherapy. Her radiation treatments would include external beam radiation therapy along with intracavitary brachytherapy treatments. I discussed the treatment course side effects and potential long-term toxicities of radiation therapy in this situation with the patient. She appears to understand and wishes to proceed with planned course of treatment. The patient does live in [place] and wishes to receive her external beam radiation therapy at the [facility]. In light of her bleeding issues she will undergo simulation tomorrow in [place] and begin treatments on  December 18. Treatments will be directed at the pelvis. If her PET scan shows additional findings then her radiation fields may be modified accordingly. The patient will return to [facility] for her intracavitary high dose rate brachytherapy treatments or if she decides on low dose rate radiation therapy will proceed with cesium  treatment at [facility]  PLAN: Simulation and planning December 17  I spent 60 minutes minutes face to face with the patient and more than 50% of that time was spent in counseling and/or coordination of care.   ------------------------------------------------  -----------------------------------  Billie Lade, PhD, MD

## 2013-04-11 NOTE — Telephone Encounter (Signed)
Called Tammie Marsh to see if she can some in 15 minutes early to have lab work done.  Tammie Marsh said she could come in at 1:45.  Lab appt made for 1:45.

## 2013-04-11 NOTE — Progress Notes (Signed)
GYN Location of Tumor / Histology: Cervix  Patient reports over the last year she's had severe abdominal cramping with intermittent episodes of heavy bleeding. She presented to emergency room on 03/13/2013 with heavy vaginal bleeding. A CT scan of the abdomen and pelvis was notable for a mass expanding the cervix and lower uterine segment measuring 7.9 x 4.7 cm x 3 cm a solid right adnexal mass measuring 5.5 cm in size was also noted this was thought to reflect a pedunculated fibroid. There is a single enhancing left external iliac lymph node measuring 14 mm in the short axis. Patient was taken to the operating room on 03/17/2013 at which time there was a biopsy performed of the cervical mass and or aborted hysteroscopy D&C.  Biopsies of cervix revealed: Cervix, biopsy INVASIVE POORLY DIFFERENTIATED SQUAMOUS CELL CARCINOMA  Past/Anticipated interventions by Gyn/Onc surgery, if any: biopsy of cervical mass - 03/17/2013, previous long standing abnormal pap tests ,several  treatments with cryotherapy, and cold knife cone bx, D&C   Past/Anticipated interventions by medical oncology, if any: appt with Dr. Darrold Span 04/17/2013  Weight changes, if any: no  Bowel/Bladder complaints, if any: bleeding vaginal wearing a pad now, went to the ED last night, bleeding a lot bright red blood, bowels sometimes diarrhea, but regular at present  Nausea/Vomiting, if any: no  Pain issues, if any:  Feels pressure to have bowel movement,nothing coming out  SAFETY ISSUES:  Prior radiation? No  Pacemaker/ICD? no  Possible current pregnancy? No  Is the patient on methotrexate? no  Current Complaints / other details:  Single,  G0PO,, [place] age 52, last menses 2012,non smoker, no alcohol or ilicit drugs

## 2013-04-11 NOTE — Progress Notes (Signed)
Please see the Nurse Progress Note in the MD Initial Consult Encounter for this patient. 

## 2013-04-14 ENCOUNTER — Encounter (HOSPITAL_COMMUNITY)
Admission: RE | Admit: 2013-04-14 | Discharge: 2013-04-14 | Disposition: A | Discharge status: Home / Self Care | Payer: Managed Care, Other (non HMO) | Source: Ambulatory Visit | Attending: Gynecologic Oncology | Admitting: Gynecologic Oncology

## 2013-04-14 DIAGNOSIS — C539 Malignant neoplasm of cervix uteri, unspecified: Secondary | ICD-10-CM | POA: Insufficient documentation

## 2013-04-14 DIAGNOSIS — N888 Other specified noninflammatory disorders of cervix uteri: Secondary | ICD-10-CM

## 2013-04-14 DIAGNOSIS — N9489 Other specified conditions associated with female genital organs and menstrual cycle: Secondary | ICD-10-CM | POA: Insufficient documentation

## 2013-04-14 MED ORDER — FLUDEOXYGLUCOSE F - 18 (FDG) INJECTION
16.9000 | Freq: Once | INTRAVENOUS | Status: AC | PRN
  Administered 2013-04-14: 16.9 via INTRAVENOUS

## 2013-04-17 ENCOUNTER — Ambulatory Visit: Payer: Managed Care, Other (non HMO)

## 2013-04-17 ENCOUNTER — Ambulatory Visit: Payer: Managed Care, Other (non HMO) | Admitting: Oncology

## 2013-04-17 ENCOUNTER — Other Ambulatory Visit: Payer: Managed Care, Other (non HMO)

## 2013-04-18 ENCOUNTER — Encounter (HOSPITAL_COMMUNITY): Payer: Managed Care, Other (non HMO)

## 2013-04-19 ENCOUNTER — Telehealth: Payer: Self-pay | Admitting: Gynecologic Oncology

## 2013-04-19 ENCOUNTER — Ambulatory Visit: Payer: Commercial Indemnity

## 2013-04-19 ENCOUNTER — Telehealth: Payer: Self-pay | Admitting: *Deleted

## 2013-04-19 ENCOUNTER — Ambulatory Visit: Payer: Commercial Indemnity | Admitting: Radiation Oncology

## 2013-04-19 NOTE — Telephone Encounter (Signed)
FAXED LABS TO [facility] ON 04-11-13 AT 4:30 PM, CONFIRMATION RECEIVED WHERE LABS WENT THROUGH

## 2013-04-19 NOTE — Telephone Encounter (Signed)
Patient informed of PET scan results.  Instructed to call for any needs.

## 2013-04-24 ENCOUNTER — Encounter: Payer: Self-pay | Admitting: *Deleted

## 2013-04-24 NOTE — Progress Notes (Signed)
[facility] Psychosocial Distress Screening Clinical Social Work  Clinical Social Work was referred by distress screening protocol.  The patient scored a 6 on the Psychosocial Distress Thermometer which indicates moderate distress. Clinical Social Worker attempted to contact patient by phone to assess for distress and other psychosocial needs. CSW unable to reach patient, left voicemail to return call when convenient.    Kathrin Penner, MSW, LCSW, OSW-C Clinical Social Worker [facility] [phone]

## 2013-05-05 ENCOUNTER — Other Ambulatory Visit: Payer: Self-pay | Admitting: Radiation Oncology

## 2013-05-05 ENCOUNTER — Encounter (HOSPITAL_BASED_OUTPATIENT_CLINIC_OR_DEPARTMENT_OTHER): Payer: Self-pay | Admitting: *Deleted

## 2013-05-08 ENCOUNTER — Other Ambulatory Visit: Payer: Self-pay | Admitting: Radiation Oncology

## 2013-05-09 ENCOUNTER — Other Ambulatory Visit: Payer: Self-pay | Admitting: Radiation Oncology

## 2013-05-09 ENCOUNTER — Encounter (HOSPITAL_BASED_OUTPATIENT_CLINIC_OR_DEPARTMENT_OTHER): Payer: Self-pay | Admitting: *Deleted

## 2013-05-09 NOTE — Progress Notes (Signed)
NPO AFTER MN. ARRIVE AT 0600. CURRENT LAB RESULTS TO BE FAXED FROM [facility]. WILL TAKE SYNTHROID, PAXIL, AND KLONOPIN AM DOS W/ SIPS OF WATER.

## 2013-05-10 ENCOUNTER — Other Ambulatory Visit: Payer: Self-pay | Admitting: Radiation Oncology

## 2013-05-10 ENCOUNTER — Telehealth: Payer: Self-pay | Admitting: Gynecologic Oncology

## 2013-05-10 ENCOUNTER — Telehealth: Payer: Self-pay | Admitting: *Deleted

## 2013-05-10 DIAGNOSIS — Z923 Personal history of irradiation: Secondary | ICD-10-CM

## 2013-05-10 NOTE — Telephone Encounter (Signed)
Called patient to remind of surgery on 05-11-13, spoke with patient and she is aware of this procedure.

## 2013-05-10 NOTE — Telephone Encounter (Signed)
Called to check on patient's status.  She states she felt rough areas in her vaginal before she started treatment and now it feels smooth.  She has a procedure tomorrow am with Dr. Sondra Come.  Advised to call for any questions or concerns.

## 2013-05-10 NOTE — Anesthesia Preprocedure Evaluation (Addendum)
Anesthesia Evaluation  Patient identified by MRN, date of birth, ID band Patient awake    Reviewed: Allergy & Precautions, H&P , NPO status , Patient's Chart, lab work & pertinent test results  Airway Mallampati: II TM Distance: >3 FB Neck ROM: full    Dental  (+) Caps and Dental Advisory Given Cap lower front:   Pulmonary neg pulmonary ROS,  History PE breath sounds clear to auscultation  Pulmonary exam normal       Cardiovascular Exercise Tolerance: Good negative cardio ROS  Rhythm:regular Rate:Normal     Neuro/Psych Anxiety negative neurological ROS  negative psych ROS   GI/Hepatic negative GI ROS, Neg liver ROS,   Endo/Other  negative endocrine ROSHypothyroidism   Renal/GU negative Renal ROS  negative genitourinary   Musculoskeletal   Abdominal   Peds  Hematology  (+) Blood dyscrasia, , Factor V Leiden mutation   Anesthesia Other Findings   Reproductive/Obstetrics negative OB ROS Cervical cancer                          Anesthesia Physical Anesthesia Plan  ASA: III  Anesthesia Plan: General   Post-op Pain Management:    Induction: Intravenous  Airway Management Planned: LMA  Additional Equipment:   Intra-op Plan:   Post-operative Plan:   Informed Consent: I have reviewed the patients History and Physical, chart, labs and discussed the procedure including the risks, benefits and alternatives for the proposed anesthesia with the patient or authorized representative who has indicated his/her understanding and acceptance.   Dental Advisory Given  Plan Discussed with: CRNA and Surgeon  Anesthesia Plan Comments:         Anesthesia Quick Evaluation

## 2013-05-10 NOTE — H&P (Signed)
Radiation Oncology         [phone] ________________________________  Pre-operative note  Name: Tammie Marsh MRN: [id number]  Date: 05/05/2013  DOB: May 27, 1960  IP:[name], [name], DO  No ref. 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 female who is recently was diagnosed with advanced squamous cell carcinoma cervix.  On PET scan she was found to have lymphadenopathy within the pelvis and retroperitoneal area. Patient is currently receiving external beam radiation therapy at the [facility] in [place]. The patient has completed 32.4 gray. She is scheduled for her first brachytherapy procedure on January 15. She will undergo exam under anesthesia and placement of fiducial markers as well as a tandem/ring system for high-dose rate radiation therapy.    PREVIOUS RADIATION THERAPY: No  PAST MEDICAL HISTORY:  has a past medical history of Factor V Leiden mutation; Anxiety; Hypothyroidism; ASCUS on Pap smear; Ovarian cyst; History of pulmonary embolism; Cervical cancer (dx 04-06-2013  [facility]/  [facility] DR Sondra Come FOR BRACHYTHERAPY (HIGH DOSE RADIATION VIA TANDEM RING)); and MRSA (methicillin resistant staph aureus) culture positive.    PAST SURGICAL HISTORY: Past Surgical History  Procedure Laterality Date  . Cervical biopsy  03/17/2013  . Cervical conization w/bx  2009    FAMILY HISTORY: family history includes Stroke in her mother.  SOCIAL HISTORY:  reports that she has never smoked. She has never used smokeless tobacco. She reports that she does not drink alcohol or use illicit drugs.  ALLERGIES: Review of patient's allergies indicates no known allergies.  MEDICATIONS:  No current facility-administered medications for this  encounter.   Current Outpatient Prescriptions  Medication Sig Dispense Refill  . Biotin 10 MG TABS Take 10 mg by mouth daily.       . cholecalciferol (VITAMIN D) 1000 UNITS tablet Take 1,000 Units by mouth daily.      . clindamycin (CLEOCIN) 300 MG capsule Take 300 mg by mouth 3 (three) times daily.      . clonazePAM (KLONOPIN) 2 MG tablet Take 2 mg by mouth 2 (two) times daily.       . cyanocobalamin 1000 MCG tablet Take 100 mcg by mouth daily.      Marland Kitchen HYDROcodone-acetaminophen (NORCO/VICODIN) 5-325 MG per tablet Take 1 tablet by mouth every 6 (six) hours as needed for moderate pain.      Marland Kitchen levothyroxine (SYNTHROID, LEVOTHROID) 112 MCG tablet Take 112 mcg by mouth daily before breakfast.      . PARoxetine (PAXIL) 30 MG tablet Take 60 mg by mouth daily.       . Sulfamethoxazole-Trimethoprim (BACTRIM PO) Take 1 tablet by mouth 2 (two) times daily.      . vitamin E 400 UNIT capsule Take 400 Units by mouth daily.        REVIEW OF SYSTEMS:  A 15 point review of systems is documented in the electronic medical record. This was obtained by the nursing staff. However, I reviewed this with the patient to discuss relevant findings and make appropriate changes. She is tolerating her external beam and radiosensitizing chemotherapy well. Her vaginal bleeding has stopped at this time.   PHYSICAL EXAM:  height is 5\' 5"  (1.651 m) and weight is 136 lb (61.689 kg).   General Appearance:  Alert, cooperative, no distress,  appears stated age   Head:  Normocephalic, without obvious abnormality, atraumatic   Eyes:  PERRL, conjunctiva/corneas clear, EOM's intact   Ears:  Normal TM's and external ear canals, both ears   Nose:  Nares normal, septum midline, mucosa normal, no drainage or sinus tenderness   Throat:  Lips, mucosa, and tongue normal; teeth and gums normal   Neck:  Supple, symmetrical, trachea midline, no adenopathy;  thyroid: no enlargement/tenderness/nodules; no carotid  bruit or JVD   Back:   Symmetric, no curvature, ROM normal, no CVA tenderness   Lungs:  Clear to auscultation bilaterally, respirations unlabored   Chest Wall:  No tenderness or deformity   Heart:  Regular rate and rhythm, S1 and S2 normal, no murmur, rub or gallop      Abdomen:  Soft, non-tender, bowel sounds active all four quadrants,  no masses, no organomegaly   Genitalia:  Normal external genitalia, on speculum examination there is no bleeding noted in the vaginal vault. the cervix area has biopsy changes with no active bleeding. on bimanual examination the cervix was expanded to approximately 6 cm and quite firm with palpation. The cervix protrudes approximately 1/3 the way down the vaginal vault. The vaginal fornices appear intact. It is difficult to determine parametrial involvement. No obvious sidewall involvement   Rectal:  Normal tone, confirms bimanual exam   Extremities:  Extremities normal, atraumatic, no cyanosis or edema   Pulses:  2+ and symmetric all extremities   Skin:  Skin color, texture, turgor normal, no rashes or lesions   Lymph nodes:  Cervical, supraclavicular, and axillary nodes normal   Neurologic:  CNII-XII intact, normal strength, sensation and reflexes  throughout       LABORATORY DATA: Current labs will be faxed from the [place] [facility] today   RADIOGRAPHY: Nm Pet Image Initial (pi) Skull Base To Thigh  04/14/2013   CLINICAL DATA:  Initial treatment strategy for cervical cancer.  EXAM: NUCLEAR MEDICINE PET SKULL BASE TO THIGH  FASTING BLOOD GLUCOSE:  Value: 101mg /dl  TECHNIQUE: 16.9 mCi F-18 FDG was injected intravenously. CT data was obtained and used for attenuation correction and anatomic localization only. (This was not acquired as a diagnostic CT examination.) Additional exam technical data entered on technologist worksheet.  COMPARISON:  CT scan 03/13/2013.  FINDINGS: NECK  No hypermetabolic lymph nodes in the neck.  CHEST  No hypermetabolic mediastinal or hilar nodes. No  suspicious pulmonary nodules on the CT scan.  ABDOMEN/PELVIS  The solid abdominal organs are unremarkable. No findings for metastatic disease. There is a large bulky cervical mass as demonstrated on the recent CT scan. This demonstrates marked FDG uptake with SUV max of 26.9. There is also an FDG positive (SUV max 6.4) 15 mm external iliac lymph node on the left and a smaller positive (3.1 SUV max) left perirectal lymph node. A small retroperitoneal lymph node located at the aortic bifurcation level is also weakly FDG positive (SUV max 3.2).  The right adnexal lesion does not show any FDG uptake and could be an exophytic fibroid or possibly a benign ovarian fibrothecoma. There are small cyst associated with both ovaries.  SKELETON  No focal hypermetabolic activity to suggest skeletal metastasis.  IMPRESSION: 1. Large cervical mass with neoplastic range FDG uptake (SUV max 26.9). 2. FDG positive pelvic and retroperitoneal lymph nodes. 3. The 4.5 cm right adnexal mass does not demonstrate abnormal FDG uptake and could be an exophytic fibroid or possibly an ovarian fibrothecoma.   Electronically Signed  By: Kalman Jewels M.D.   On: 04/14/2013 13:07      IMPRESSION: Advanced cervical cancer  PLAN: Patient will be taken to the operating room on January 15 for exam under anesthesia, placement of fiducial markers and a tandem ring system in preparation for high-dose rate radiation therapy. Patient will receive 5 weekly high-dose rate treatments. She will continue her external beam radiation therapy on weekdays she is not receiving brachytherapy treatments. ------------------------------------------------  -----------------------------------  Blair Promise, PhD, MD

## 2013-05-10 NOTE — Telephone Encounter (Signed)
Patient called asking about a "letter that was to be sent to Dr. Ethelene Hal asking her why the cervical cancer was not found in April from Dr. [name]."  Advised that Dr. Leone Brand office note was routed to Dr. Ethelene Hal in [place] but Dr. [name] had not sent a letter to Dr. Ethelene Hal with the above concerns.  Informed that she would need to speak with Dr. Ethelene Hal about her examination in April.  Advised to please call for any questions or concerns.

## 2013-05-11 ENCOUNTER — Ambulatory Visit (HOSPITAL_BASED_OUTPATIENT_CLINIC_OR_DEPARTMENT_OTHER): Payer: Managed Care, Other (non HMO) | Admitting: Anesthesiology

## 2013-05-11 ENCOUNTER — Ambulatory Visit (HOSPITAL_COMMUNITY)
Admission: RE | Admit: 2013-05-11 | Discharge: 2013-05-11 | Disposition: A | Discharge status: Home / Self Care | Payer: Managed Care, Other (non HMO) | Source: Ambulatory Visit | Attending: Radiation Oncology | Admitting: Radiation Oncology

## 2013-05-11 ENCOUNTER — Ambulatory Visit
Admission: RE | Admit: 2013-05-11 | Discharge: 2013-05-11 | Disposition: A | Discharge status: Home / Self Care | Payer: Managed Care, Other (non HMO) | Source: Ambulatory Visit | Attending: Radiation Oncology | Admitting: Radiation Oncology

## 2013-05-11 ENCOUNTER — Encounter (HOSPITAL_BASED_OUTPATIENT_CLINIC_OR_DEPARTMENT_OTHER): Payer: Self-pay | Admitting: *Deleted

## 2013-05-11 ENCOUNTER — Encounter (HOSPITAL_BASED_OUTPATIENT_CLINIC_OR_DEPARTMENT_OTHER): Payer: Managed Care, Other (non HMO) | Admitting: Anesthesiology

## 2013-05-11 ENCOUNTER — Encounter (HOSPITAL_BASED_OUTPATIENT_CLINIC_OR_DEPARTMENT_OTHER)
Admission: RE | Disposition: A | Discharge status: Home / Self Care | Payer: Self-pay | Source: Ambulatory Visit | Attending: Radiation Oncology

## 2013-05-11 ENCOUNTER — Ambulatory Visit (HOSPITAL_BASED_OUTPATIENT_CLINIC_OR_DEPARTMENT_OTHER)
Admission: RE | Admit: 2013-05-11 | Discharge: 2013-05-11 | Disposition: A | Discharge status: Home / Self Care | Payer: Managed Care, Other (non HMO) | Source: Ambulatory Visit | Attending: Radiation Oncology | Admitting: Radiation Oncology

## 2013-05-11 VITALS — BP 123/66 | HR 64 | Temp 97.5°F

## 2013-05-11 DIAGNOSIS — Z923 Personal history of irradiation: Secondary | ICD-10-CM

## 2013-05-11 DIAGNOSIS — Z79899 Other long term (current) drug therapy: Secondary | ICD-10-CM | POA: Insufficient documentation

## 2013-05-11 DIAGNOSIS — R599 Enlarged lymph nodes, unspecified: Secondary | ICD-10-CM | POA: Insufficient documentation

## 2013-05-11 DIAGNOSIS — Z86711 Personal history of pulmonary embolism: Secondary | ICD-10-CM | POA: Insufficient documentation

## 2013-05-11 DIAGNOSIS — E039 Hypothyroidism, unspecified: Secondary | ICD-10-CM | POA: Insufficient documentation

## 2013-05-11 DIAGNOSIS — D6859 Other primary thrombophilia: Secondary | ICD-10-CM | POA: Insufficient documentation

## 2013-05-11 DIAGNOSIS — C539 Malignant neoplasm of cervix uteri, unspecified: Secondary | ICD-10-CM | POA: Insufficient documentation

## 2013-05-11 DIAGNOSIS — Z8614 Personal history of Methicillin resistant Staphylococcus aureus infection: Secondary | ICD-10-CM | POA: Insufficient documentation

## 2013-05-11 DIAGNOSIS — C549 Malignant neoplasm of corpus uteri, unspecified: Secondary | ICD-10-CM | POA: Insufficient documentation

## 2013-05-11 HISTORY — DX: Malignant neoplasm of cervix uteri, unspecified: C53.9

## 2013-05-11 HISTORY — PX: TANDEM RING INSERTION: SHX6199

## 2013-05-11 HISTORY — DX: Personal history of pulmonary embolism: Z86.711

## 2013-05-11 HISTORY — DX: Carrier or suspected carrier of methicillin resistant Staphylococcus aureus: Z22.322

## 2013-05-11 SURGERY — INSERTION, UTERINE TANDEM AND RING OR CYLINDER, FOR BRACHYTHERAPY
Anesthesia: General | Site: Vagina

## 2013-05-11 MED ORDER — WATER FOR IRRIGATION, STERILE IR SOLN
Status: DC | PRN
  Administered 2013-05-11: 3000 mL via INTRAVESICAL

## 2013-05-11 MED ORDER — FENTANYL CITRATE 0.05 MG/ML IJ SOLN
INTRAMUSCULAR | Status: DC | PRN
  Administered 2013-05-11 (×2): 50 ug via INTRAVENOUS

## 2013-05-11 MED ORDER — DEXAMETHASONE SODIUM PHOSPHATE 4 MG/ML IJ SOLN
INTRAMUSCULAR | Status: DC | PRN
Start: 2013-05-11 — End: 2013-05-11
  Administered 2013-05-11: 10 mg via INTRAVENOUS

## 2013-05-11 MED ORDER — HEPARIN SOD (PORK) LOCK FLUSH 100 UNIT/ML IV SOLN
500.0000 [IU] | Freq: Once | INTRAVENOUS | Status: AC
  Administered 2013-05-11: 500 [IU] via INTRAVENOUS

## 2013-05-11 MED ORDER — ACETAMINOPHEN 10 MG/ML IV SOLN
INTRAVENOUS | Status: DC | PRN
  Administered 2013-05-11: 1000 mg via INTRAVENOUS

## 2013-05-11 MED ORDER — MIDAZOLAM HCL 5 MG/5ML IJ SOLN
INTRAMUSCULAR | Status: DC | PRN
  Administered 2013-05-11: 2 mg via INTRAVENOUS

## 2013-05-11 MED ORDER — LIDOCAINE HCL (CARDIAC) 20 MG/ML IV SOLN
INTRAVENOUS | Status: DC | PRN
  Administered 2013-05-11: 80 mg via INTRAVENOUS

## 2013-05-11 MED ORDER — SODIUM CHLORIDE 0.9 % IJ SOLN
10.0000 mL | Freq: Once | INTRAMUSCULAR | Status: AC
  Administered 2013-05-11: 10 mL via INTRAVENOUS

## 2013-05-11 MED ORDER — MIDAZOLAM HCL 2 MG/2ML IJ SOLN
INTRAMUSCULAR | Status: AC
  Filled 2013-05-11: qty 2

## 2013-05-11 MED ORDER — HYDROMORPHONE HCL PF 1 MG/ML IJ SOLN
1.0000 mg | Freq: Once | INTRAMUSCULAR | Status: AC
  Administered 2013-05-11: 1 mg via INTRAVENOUS
  Filled 2013-05-11: qty 1

## 2013-05-11 MED ORDER — LACTATED RINGERS IV SOLN
INTRAVENOUS | Status: DC
  Filled 2013-05-11: qty 1000

## 2013-05-11 MED ORDER — FENTANYL CITRATE 0.05 MG/ML IJ SOLN
INTRAMUSCULAR | Status: AC
  Filled 2013-05-11: qty 2

## 2013-05-11 MED ORDER — FENTANYL CITRATE 0.05 MG/ML IJ SOLN
25.0000 ug | INTRAMUSCULAR | Status: DC | PRN
  Administered 2013-05-11 (×2): 25 ug via INTRAVENOUS
  Filled 2013-05-11: qty 1
  Filled 2013-05-11: qty 2

## 2013-05-11 MED ORDER — PROPOFOL 10 MG/ML IV BOLUS
INTRAVENOUS | Status: DC | PRN
  Administered 2013-05-11: 150 mg via INTRAVENOUS

## 2013-05-11 MED ORDER — GLYCOPYRROLATE 0.2 MG/ML IJ SOLN
INTRAMUSCULAR | Status: DC | PRN
  Administered 2013-05-11: 0.2 mg via INTRAVENOUS

## 2013-05-11 MED ORDER — KETOROLAC TROMETHAMINE 30 MG/ML IJ SOLN
INTRAMUSCULAR | Status: DC | PRN
  Administered 2013-05-11: 30 mg via INTRAVENOUS

## 2013-05-11 MED ORDER — LACTATED RINGERS IV SOLN
INTRAVENOUS | Status: DC
  Administered 2013-05-11: 07:00:00 via INTRAVENOUS
  Filled 2013-05-11: qty 1000

## 2013-05-11 MED ORDER — HEPARIN SOD (PORK) LOCK FLUSH 100 UNIT/ML IV SOLN: 250.0000 [IU] | Freq: Once | INTRAVENOUS | Status: DC

## 2013-05-11 MED ORDER — ESTRADIOL 0.1 MG/GM VA CREA
TOPICAL_CREAM | VAGINAL | Status: DC | PRN
  Administered 2013-05-11: 1 via VAGINAL

## 2013-05-11 MED ORDER — ONDANSETRON HCL 4 MG/2ML IJ SOLN
INTRAMUSCULAR | Status: DC | PRN
  Administered 2013-05-11: 4 mg via INTRAVENOUS

## 2013-05-11 MED ORDER — FENTANYL CITRATE 0.05 MG/ML IJ SOLN
INTRAMUSCULAR | Status: AC
  Filled 2013-05-11: qty 4

## 2013-05-11 SURGICAL SUPPLY — 39 items
BAG URINE DRAINAGE (UROLOGICAL SUPPLIES) ×3 IMPLANT
BNDG CONFORM 2 STRL LF (GAUZE/BANDAGES/DRESSINGS) ×3 IMPLANT
CATH FOLEY 2WAY SLVR  5CC 16FR (CATHETERS) ×2
CATH FOLEY 2WAY SLVR 5CC 16FR (CATHETERS) ×1 IMPLANT
CLOTH BEACON ORANGE TIMEOUT ST (SAFETY) ×3 IMPLANT
COVER TABLE BACK 60X90 (DRAPES) ×3 IMPLANT
DRAPE LG THREE QUARTER DISP (DRAPES) ×3 IMPLANT
DRAPE UNDERBUTTOCKS STRL (DRAPE) ×3 IMPLANT
DRSG PAD ABDOMINAL 8X10 ST (GAUZE/BANDAGES/DRESSINGS) ×3 IMPLANT
GLOVE BIO SURGEON STRL SZ7.5 (GLOVE) ×3 IMPLANT
GLOVE BIOGEL PI IND STRL 7.0 (GLOVE) ×1 IMPLANT
GLOVE BIOGEL PI IND STRL 7.5 (GLOVE) ×2 IMPLANT
GLOVE BIOGEL PI INDICATOR 7.0 (GLOVE) ×2
GLOVE BIOGEL PI INDICATOR 7.5 (GLOVE) ×4
GLOVE ECLIPSE 7.0 STRL STRAW (GLOVE) ×3 IMPLANT
GLOVE SURG SS PI 7.5 STRL IVOR (GLOVE) ×3 IMPLANT
GOWN PREVENTION PLUS LG XLONG (DISPOSABLE) IMPLANT
GOWN STRL REUS W/TWL LRG LVL3 (GOWN DISPOSABLE) ×6 IMPLANT
GOWN STRL REUS W/TWL XL LVL3 (GOWN DISPOSABLE) ×6 IMPLANT
HOLDER FOLEY CATH W/STRAP (MISCELLANEOUS) ×3 IMPLANT
LEGGING LITHOTOMY PAIR STRL (DRAPES) ×3 IMPLANT
NEEDLE SPNL 22GX3.5 QUINCKE BK (NEEDLE) IMPLANT
PACK BASIN DAY SURGERY FS (CUSTOM PROCEDURE TRAY) ×3 IMPLANT
PACKING VAGINAL (PACKING) IMPLANT
PAD ABD 8X10 STRL (GAUZE/BANDAGES/DRESSINGS) ×3 IMPLANT
PAD OB MATERNITY 4.3X12.25 (PERSONAL CARE ITEMS) ×3 IMPLANT
PAD PREP 24X48 CUFFED NSTRL (MISCELLANEOUS) ×3 IMPLANT
PLUG CATH AND CAP STER (CATHETERS) ×3 IMPLANT
SET IRRIG Y TYPE TUR BLADDER L (SET/KITS/TRAYS/PACK) ×3 IMPLANT
SUT PROLENE 0 SH 30 (SUTURE) IMPLANT
SUT SILK 2 0 30  PSL (SUTURE)
SUT SILK 2 0 30 PSL (SUTURE) IMPLANT
SYR BULB IRRIGATION 50ML (SYRINGE) IMPLANT
SYR CONTROL 10ML LL (SYRINGE) IMPLANT
SYRINGE 10CC LL (SYRINGE) ×3 IMPLANT
TOWEL OR 17X24 6PK STRL BLUE (TOWEL DISPOSABLE) ×6 IMPLANT
TRAY DSU PREP LF (CUSTOM PROCEDURE TRAY) ×3 IMPLANT
WATER STERILE IRR 3000ML UROMA (IV SOLUTION) ×3 IMPLANT
WATER STERILE IRR 500ML POUR (IV SOLUTION) ×3 IMPLANT

## 2013-05-11 NOTE — Interval H&P Note (Signed)
History and Physical Interval Note:  05/11/2013 7:41 AM  Tammie Marsh  has presented today for surgery, with the diagnosis of CERVICAL CANCER  The various methods of treatment have been discussed with the patient and family. After consideration of risks, benefits and other options for treatment, the patient has consented to  Procedure(s): TANDEM RING INSERTION (N/A) as a surgical intervention .  The patient's history has been reviewed, patient examined, no change in status, stable for surgery.  I have reviewed the patient's chart and labs.  Questions were answered to the patient's satisfaction.     Gery Pray D

## 2013-05-11 NOTE — Progress Notes (Signed)
Patient given 1 mg dilaudid IV for pain of a 6/10 due to cramping.

## 2013-05-11 NOTE — Progress Notes (Signed)
   Department of Radiation Oncology  Phone:  [phone] Fax:        [phone]  Simulation note  Patient was brought down from the outpatient surgical center and placed on the CT scan her table. A rectal tube was placed with contrast instilled into the rectal vault. The patient's bladder catheter was accessed and contrast was placed in the bladder. Fiducial markers were placed within the tandem/green system. Patient proceeded to undergo CT scan through the pelvis area. The tandem was in good position within the endometrial cavity. The ring was closely approximated to the cervix. Patient will proceed with planning for her first high-dose-rate treatment. She will receive 5.5 gray.  -----------------------------------  Blair Promise, PhD, MD

## 2013-05-11 NOTE — Progress Notes (Signed)
Patient rating pain at a 4/10

## 2013-05-11 NOTE — Progress Notes (Signed)
Received report from [place], Therapist, sports.  Patient denies nausea.  She is having cramping.  She has a left upper arm PICC line with LR infusing at 100 ml/hr.  She has a foley catheter draining pale, yellow urine.  Patient transported to [facility] with Nicki Reaper, Nurse Transporter.

## 2013-05-11 NOTE — Transfer of Care (Signed)
Immediate Anesthesia Transfer of Care Note  Patient: Tammie Marsh  Procedure(s) Performed: Procedure(s): EXAMINATION UNDER ANETHESIA, PLACEMENT OF GOLD MARKERS, PLACEMENT OF TANDEM RING FOR HIGH DOSE RADIATION THERAPY (N/A)  Patient Location: PACU  Anesthesia Type:General  Level of Consciousness: sedated and responds to stimulation  Airway & Oxygen Therapy: Patient Spontanous Breathing and Patient connected to nasal cannula oxygen  Post-op Assessment: Report given to PACU RN  Post vital signs: Reviewed and stable  Complications: No apparent anesthesia complications

## 2013-05-11 NOTE — Discharge Instructions (Signed)

## 2013-05-11 NOTE — Progress Notes (Signed)
05/11/2013  9:12 AM  PATIENT:  Tammie Marsh  53 y.o. female  PRE-OPERATIVE DIAGNOSIS:  CERVICAL CANCER  POST-OPERATIVE DIAGNOSIS:  CERVICAL CANCER  PROCEDURE:  Procedure(s): EXAMINATION UNDER ANETHESIA, PLACEMENT OF GOLD MARKERS, PLACEMENT OF TANDEM RING FOR HIGH DOSE RADIATION THERAPY (N/A)  SURGEON:  Surgeon(s) and Role:    * Blair Promise, MD - Primary  PHYSICIAN ASSISTANT:   ASSISTANTS: none   ANESTHESIA:   general, LMA  EBL:  Total I/O In: 150 [I.V.:150] Out: - 250 cc, EBL, 10 cc  BLOOD ADMINISTERED:none  DRAINS: Urinary Catheter (Foley)   LOCAL MEDICATIONS USED:  NONE  SPECIMEN:  No Specimen  DISPOSITION OF SPECIMEN:  N/A  COUNTS:  YES  TOURNIQUET:  * No tourniquets in log *  DICTATION: The patient was taken to the operating room and placed in the dorsolithotomy position. Patient was prepped and draped in the usual sterile fashion. A Foley catheter was placed without difficulty. The patient then proceeded to undergo examination under anesthesia. Compared to exam prior to starting treatment her cervical mass had decreased in size. There appeared to be involvement of the left parametria with limited mobility along the left side. The cervix was retracted slightly to the left. Estimated cervical size was approximately 4-5 cm. Patient had placement of 4 gold fiducial markers within the cervical region. Patient then proceeded to undergo a uterine sounding and dilation of the cervix. This was performed with the assistance of intraoperative ultrasound.  Prior to ultrasound the patient had approximately 200 cc of sterile water placed in the bladder. Good images were obtained however an accurate size estimate of the cervical mass could not be obtained. The uterus was moderately anteverted and sounded to approximately 6-7 cm. The cervix was dilated and then the patient had placement of a 45, a 60 mm tandem within the uterine cavity. Excellent placement was noted on intraoperative  ultrasound. Patient then had a 30 mm ring/45 orientation placed at the cervix area.This ring was attached to the tandem. She then had a 45 rectal paddle placed. Sterile packing soaked in Estrace cream was placed along the rectum as well as the region of the bladder. Patient tolerated the procedure well. She did have some bleeding from the cervical mass but this bleeding was stopped easily with gauze application and pressure.  After she has recovered from anesthesia she will be transported to the radiation oncology Department for planning and her first high-dose-rate treatment with iridium 192 as the high-dose-rate source.  PLAN OF CARE: transfer to [place] for planning and treatment  PATIENT DISPOSITION:  PACU - hemodynamically stable.   Delay start of Pharmacological VTE agent (>24hrs) due to surgical blood loss or risk of bleeding: not applicable

## 2013-05-11 NOTE — Anesthesia Postprocedure Evaluation (Signed)
  Anesthesia Post-op Note  Patient: Tammie Marsh  Procedure(s) Performed: Procedure(s) (LRB): EXAMINATION UNDER ANETHESIA, PLACEMENT OF GOLD MARKERS, PLACEMENT OF TANDEM RING FOR HIGH DOSE RADIATION THERAPY (N/A)  Patient Location: PACU  Anesthesia Type: General  Level of Consciousness: awake and alert   Airway and Oxygen Therapy: Patient Spontanous Breathing  Post-op Pain: mild  Post-op Assessment: Post-op Vital signs reviewed, Patient's Cardiovascular Status Stable, Respiratory Function Stable, Patent Airway and No signs of Nausea or vomiting  Last Vitals:  Filed Vitals:   05/11/13 0930  BP: 125/66  Pulse: 74  Temp:   Resp: 9    Post-op Vital Signs: stable   Complications: No apparent anesthesia complications

## 2013-05-11 NOTE — Anesthesia Procedure Notes (Signed)
Procedure Name: LMA Insertion Date/Time: 05/11/2013 7:40 AM Performed by: Bethena Roys T Pre-anesthesia Checklist: Patient identified, Emergency Drugs available, Suction available and Patient being monitored Patient Re-evaluated:Patient Re-evaluated prior to inductionOxygen Delivery Method: Circle System Utilized Preoxygenation: Pre-oxygenation with 100% oxygen Intubation Type: IV induction Ventilation: Mask ventilation without difficulty LMA: LMA inserted LMA Size: 4.0 Number of attempts: 1 Airway Equipment and Method: bite block Placement Confirmation: positive ETCO2 Dental Injury: Teeth and Oropharynx as per pre-operative assessment

## 2013-05-11 NOTE — Progress Notes (Signed)
   Department of Radiation Oncology  Phone:  [phone] Fax:        [phone]  Simple treatment device note  While in the operating room the patient had construction of her custom tandem/ring HDR treatment equipment. The optimal degree arrangement for the tandem/ring was a 45 angle.  A 60 mm tandem was used with a 30 mm ring.  The patient also had placement of a rectal paddle to reduce high-dose radiation to the rectum area.  -----------------------------------  Blair Promise, PhD, MD

## 2013-05-11 NOTE — Progress Notes (Signed)
Tammie Marsh released to home .  She had no voiced complaints of pain.  Her Foley catheter and the HDR device were removed without any difficulty and her IV fluids were discontinued, the PICC was flushed with 10 ml of NS and and 2.5 ml of Heparin as per protocol. The picc site was secured with a sock bandage.  Minimal vaginal bleeding noted after removal of the device.   Post-op instructions reviewed again and medication reconciliation performed.

## 2013-05-11 NOTE — Progress Notes (Addendum)
POST-ANESTHESIA   IMMEDIATELY FOLLOWING SURGERY: Do not drive or operate machinery for the first twenty four hours after surgery. Do not make any important decisions for twenty four hours after surgery or while taking narcotic pain medications or sedatives. If you develop intractable nausea and vomiting or a severe headache please notify your doctor immediately.   FOLLOW-UP: You do not need to follow up with anesthesia unless specifically instructed to do so.   WOUND CARE INSTRUCTIONS (if applicable): Expect some mild vaginal bleeding, but if large amount of bleeding occurs please contact Dr. Sondra Come at [phone] or the Radiation On-Call physician. Call for any fever greater than 101.0 degrees or increasing vaginal//abdominal pain or trouble urinating.   QUESTIONS?: Please feel free to call your physician or the hospital operator if you have any questions, and they will be happy to assist you.  Resume all medications: as listed on your after visit summary.  Your next appointment is 05/17/13.

## 2013-05-12 ENCOUNTER — Encounter (HOSPITAL_BASED_OUTPATIENT_CLINIC_OR_DEPARTMENT_OTHER): Payer: Self-pay | Admitting: Radiation Oncology

## 2013-05-14 NOTE — Progress Notes (Signed)
   Department of Radiation Oncology  Phone:  [phone] Fax:        [phone]  High-dose-rate brachytherapy procedure note   Verification simulation note  After planning was complete the patient was transferred to the high dose rate suite. Fiducial markers remained in place from her simulation. An AP and lateral film was obtained in the treatment position. This is compared to the patient's planning films earlier today documenting good position of the tandem/ring for treatment.   High-dose-rate brachytherapy procedure   The ring tandem system was attached to the high dose rate afterloading unit by catheter system. The patient proceeded to undergo her first high-dose-rate treatment directed at the cervical region. The patient was prescribed a dose of 5.5 gray. This was achieved with a total dwell time of 501.1 seconds. Patient was treated with two channels using 11 dwell positions in the tandem and 11 dwell positions within the ring system. Patient tolerated the procedure well. After completion of her therapy a radiation survey was performed documenting return of the iridium source into the Nucletron safe period.  -----------------------------------  Blair Promise, PhD, MD

## 2013-05-16 ENCOUNTER — Encounter (HOSPITAL_BASED_OUTPATIENT_CLINIC_OR_DEPARTMENT_OTHER): Payer: Self-pay | Admitting: *Deleted

## 2013-05-16 NOTE — Progress Notes (Signed)
NPO AFTER MN. ARRIVE AT 0700. PT HAD CURRENT LAB WORK DONE AT [facility] [place]. PT BRINGING COPY OF RESULTS.  ARRIVE AT 0700.  WILL TAKE SYNTHROID, KLONOPIN, AND PAXIL AM DOS W/ SIPS OF WATER.

## 2013-05-17 ENCOUNTER — Ambulatory Visit (HOSPITAL_COMMUNITY)
Admission: RE | Admit: 2013-05-17 | Discharge: 2013-05-17 | Disposition: A | Discharge status: Home / Self Care | Payer: Managed Care, Other (non HMO) | Source: Ambulatory Visit | Attending: Radiation Oncology | Admitting: Radiation Oncology

## 2013-05-17 ENCOUNTER — Encounter (HOSPITAL_BASED_OUTPATIENT_CLINIC_OR_DEPARTMENT_OTHER): Payer: Managed Care, Other (non HMO) | Admitting: Anesthesiology

## 2013-05-17 ENCOUNTER — Ambulatory Visit (HOSPITAL_BASED_OUTPATIENT_CLINIC_OR_DEPARTMENT_OTHER)
Admission: RE | Admit: 2013-05-17 | Discharge: 2013-05-17 | Disposition: A | Discharge status: Home / Self Care | Payer: Managed Care, Other (non HMO) | Source: Ambulatory Visit | Attending: Radiation Oncology | Admitting: Radiation Oncology

## 2013-05-17 ENCOUNTER — Encounter (HOSPITAL_BASED_OUTPATIENT_CLINIC_OR_DEPARTMENT_OTHER)
Admission: RE | Disposition: A | Discharge status: Home / Self Care | Payer: Self-pay | Source: Ambulatory Visit | Attending: Radiation Oncology

## 2013-05-17 ENCOUNTER — Ambulatory Visit
Admission: RE | Admit: 2013-05-17 | Discharge: 2013-05-17 | Disposition: A | Discharge status: Home / Self Care | Payer: Managed Care, Other (non HMO) | Source: Ambulatory Visit | Attending: Radiation Oncology | Admitting: Radiation Oncology

## 2013-05-17 ENCOUNTER — Other Ambulatory Visit: Payer: Self-pay | Admitting: Radiation Oncology

## 2013-05-17 ENCOUNTER — Encounter (HOSPITAL_BASED_OUTPATIENT_CLINIC_OR_DEPARTMENT_OTHER): Payer: Self-pay

## 2013-05-17 ENCOUNTER — Ambulatory Visit (HOSPITAL_BASED_OUTPATIENT_CLINIC_OR_DEPARTMENT_OTHER): Payer: Managed Care, Other (non HMO) | Admitting: Anesthesiology

## 2013-05-17 VITALS — BP 139/76 | HR 56

## 2013-05-17 DIAGNOSIS — Z79899 Other long term (current) drug therapy: Secondary | ICD-10-CM | POA: Insufficient documentation

## 2013-05-17 DIAGNOSIS — D6859 Other primary thrombophilia: Secondary | ICD-10-CM | POA: Insufficient documentation

## 2013-05-17 DIAGNOSIS — Z86711 Personal history of pulmonary embolism: Secondary | ICD-10-CM | POA: Insufficient documentation

## 2013-05-17 DIAGNOSIS — C539 Malignant neoplasm of cervix uteri, unspecified: Secondary | ICD-10-CM

## 2013-05-17 DIAGNOSIS — E039 Hypothyroidism, unspecified: Secondary | ICD-10-CM | POA: Insufficient documentation

## 2013-05-17 HISTORY — DX: Presence of other vascular implants and grafts: Z95.828

## 2013-05-17 HISTORY — PX: TANDEM RING INSERTION: SHX6199

## 2013-05-17 SURGERY — INSERTION, UTERINE TANDEM AND RING OR CYLINDER, FOR BRACHYTHERAPY
Anesthesia: General | Site: Cervix

## 2013-05-17 MED ORDER — HEPARIN SOD (PORK) LOCK FLUSH 100 UNIT/ML IV SOLN
250.0000 [IU] | Freq: Once | INTRAVENOUS | Status: AC
  Administered 2013-05-17: 250 [IU] via INTRAVENOUS

## 2013-05-17 MED ORDER — LACTATED RINGERS IV SOLN
INTRAVENOUS | Status: DC
Start: 2013-05-17 — End: 2013-05-19
  Administered 2013-05-17 (×2): via INTRAVENOUS
  Filled 2013-05-17: qty 1000

## 2013-05-17 MED ORDER — LIDOCAINE HCL (CARDIAC) 20 MG/ML IV SOLN
INTRAVENOUS | Status: DC | PRN
  Administered 2013-05-17: 60 mg via INTRAVENOUS

## 2013-05-17 MED ORDER — MIDAZOLAM HCL 5 MG/5ML IJ SOLN
INTRAMUSCULAR | Status: DC | PRN
  Administered 2013-05-17: 2 mg via INTRAVENOUS

## 2013-05-17 MED ORDER — FENTANYL CITRATE 0.05 MG/ML IJ SOLN
INTRAMUSCULAR | Status: AC
  Filled 2013-05-17: qty 6

## 2013-05-17 MED ORDER — FENTANYL CITRATE 0.05 MG/ML IJ SOLN
25.0000 ug | INTRAMUSCULAR | Status: DC | PRN
  Administered 2013-05-17 (×2): 50 ug via INTRAVENOUS
  Filled 2013-05-17: qty 2
  Filled 2013-05-17: qty 1

## 2013-05-17 MED ORDER — SODIUM CHLORIDE 0.9 % IJ SOLN
10.0000 mL | Freq: Once | INTRAMUSCULAR | Status: AC
  Administered 2013-05-17: 10 mL via INTRAVENOUS

## 2013-05-17 MED ORDER — FENTANYL CITRATE 0.05 MG/ML IJ SOLN
INTRAMUSCULAR | Status: DC | PRN
  Administered 2013-05-17: 25 ug via INTRAVENOUS
  Administered 2013-05-17: 50 ug via INTRAVENOUS
  Administered 2013-05-17: 25 ug via INTRAVENOUS

## 2013-05-17 MED ORDER — MIDAZOLAM HCL 2 MG/2ML IJ SOLN
INTRAMUSCULAR | Status: AC
  Filled 2013-05-17: qty 2

## 2013-05-17 MED ORDER — DEXAMETHASONE SODIUM PHOSPHATE 4 MG/ML IJ SOLN
INTRAMUSCULAR | Status: DC | PRN
  Administered 2013-05-17: 10 mg via INTRAVENOUS

## 2013-05-17 MED ORDER — HYDROMORPHONE HCL PF 1 MG/ML IJ SOLN
1.0000 mg | INTRAMUSCULAR | Status: DC | PRN
  Administered 2013-05-17 (×3): 0.25 mg via INTRAVENOUS
  Filled 2013-05-17 (×2): qty 1

## 2013-05-17 MED ORDER — PROPOFOL 10 MG/ML IV BOLUS
INTRAVENOUS | Status: DC | PRN
Start: 2013-05-17 — End: 2013-05-17
  Administered 2013-05-17: 200 mg via INTRAVENOUS

## 2013-05-17 MED ORDER — KETOROLAC TROMETHAMINE 30 MG/ML IJ SOLN
15.0000 mg | Freq: Once | INTRAMUSCULAR | Status: AC | PRN
  Filled 2013-05-17: qty 1

## 2013-05-17 MED ORDER — WATER FOR IRRIGATION, STERILE IR SOLN
Status: DC | PRN
  Administered 2013-05-17: 1000 mL via SURGICAL_CAVITY

## 2013-05-17 MED ORDER — HYDROCODONE-ACETAMINOPHEN 5-325 MG PO TABS
1.0000 | ORAL_TABLET | Freq: Four times a day (QID) | ORAL | Status: DC | PRN
  Administered 2013-05-17: 1 via ORAL
  Filled 2013-05-17: qty 2
  Filled 2013-05-17: qty 1

## 2013-05-17 MED ORDER — STERILE WATER FOR IRRIGATION IR SOLN
Status: DC | PRN
  Administered 2013-05-17: 3000 mL

## 2013-05-17 MED ORDER — ACETAMINOPHEN 10 MG/ML IV SOLN
INTRAVENOUS | Status: DC | PRN
  Administered 2013-05-17: 1000 mg via INTRAVENOUS

## 2013-05-17 MED ORDER — HYDROMORPHONE HCL PF 1 MG/ML IJ SOLN
1.0000 mg | Freq: Once | INTRAMUSCULAR | Status: AC
  Administered 2013-05-17: 1 mg via INTRAVENOUS
  Filled 2013-05-17: qty 1

## 2013-05-17 MED ORDER — KETOROLAC TROMETHAMINE 30 MG/ML IJ SOLN
INTRAMUSCULAR | Status: DC | PRN
  Administered 2013-05-17: 30 mg via INTRAVENOUS

## 2013-05-17 MED ORDER — ESTRADIOL 0.1 MG/GM VA CREA
TOPICAL_CREAM | VAGINAL | Status: DC | PRN
  Administered 2013-05-17: 1 via VAGINAL

## 2013-05-17 MED ORDER — ONDANSETRON HCL 4 MG/2ML IJ SOLN
INTRAMUSCULAR | Status: DC | PRN
  Administered 2013-05-17: 4 mg via INTRAVENOUS

## 2013-05-17 MED ORDER — GLYCOPYRROLATE 0.2 MG/ML IJ SOLN
INTRAMUSCULAR | Status: DC | PRN
  Administered 2013-05-17: 0.2 mg via INTRAVENOUS

## 2013-05-17 MED ORDER — PROMETHAZINE HCL 25 MG/ML IJ SOLN
6.2500 mg | INTRAMUSCULAR | Status: DC | PRN
  Filled 2013-05-17: qty 1

## 2013-05-17 SURGICAL SUPPLY — 36 items
BAG URINE DRAINAGE (UROLOGICAL SUPPLIES) ×3 IMPLANT
BNDG CONFORM 2 STRL LF (GAUZE/BANDAGES/DRESSINGS) IMPLANT
CATH FOLEY 2WAY SLVR  5CC 16FR (CATHETERS) ×2
CATH FOLEY 2WAY SLVR 5CC 16FR (CATHETERS) ×1 IMPLANT
CLOTH BEACON ORANGE TIMEOUT ST (SAFETY) IMPLANT
COVER TABLE BACK 60X90 (DRAPES) ×3 IMPLANT
DRAPE LG THREE QUARTER DISP (DRAPES) ×3 IMPLANT
DRAPE UNDERBUTTOCKS STRL (DRAPE) ×3 IMPLANT
DRSG PAD ABDOMINAL 8X10 ST (GAUZE/BANDAGES/DRESSINGS) ×3 IMPLANT
GLOVE BIO SURGEON STRL SZ7.5 (GLOVE) ×6 IMPLANT
GLOVE BIOGEL M STER SZ 6 (GLOVE) ×3 IMPLANT
GLOVE EUDERMIC 7 POWDERFREE (GLOVE) ×3 IMPLANT
GLOVE INDICATOR 6.5 STRL GRN (GLOVE) ×3 IMPLANT
GOWN PREVENTION PLUS LG XLONG (DISPOSABLE) IMPLANT
GOWN STRL REUS W/TWL LRG LVL3 (GOWN DISPOSABLE) ×3 IMPLANT
GOWN STRL REUS W/TWL XL LVL3 (GOWN DISPOSABLE) ×3 IMPLANT
HOLDER FOLEY CATH W/STRAP (MISCELLANEOUS) ×3 IMPLANT
LEGGING LITHOTOMY PAIR STRL (DRAPES) ×3 IMPLANT
NEEDLE SPNL 22GX3.5 QUINCKE BK (NEEDLE) IMPLANT
PACK BASIN DAY SURGERY FS (CUSTOM PROCEDURE TRAY) ×3 IMPLANT
PACKING VAGINAL (PACKING) ×3 IMPLANT
PAD ABD 8X10 STRL (GAUZE/BANDAGES/DRESSINGS) ×3 IMPLANT
PAD OB MATERNITY 4.3X12.25 (PERSONAL CARE ITEMS) ×3 IMPLANT
PAD PREP 24X48 CUFFED NSTRL (MISCELLANEOUS) ×3 IMPLANT
PLUG CATH AND CAP STER (CATHETERS) ×3 IMPLANT
SET IRRIG Y TYPE TUR BLADDER L (SET/KITS/TRAYS/PACK) ×3 IMPLANT
SUT PROLENE 0 SH 30 (SUTURE) IMPLANT
SUT SILK 2 0 30  PSL (SUTURE)
SUT SILK 2 0 30 PSL (SUTURE) IMPLANT
SYR BULB IRRIGATION 50ML (SYRINGE) IMPLANT
SYR CONTROL 10ML LL (SYRINGE) IMPLANT
SYRINGE 10CC LL (SYRINGE) ×3 IMPLANT
TOWEL OR 17X24 6PK STRL BLUE (TOWEL DISPOSABLE) ×6 IMPLANT
TRAY DSU PREP LF (CUSTOM PROCEDURE TRAY) ×3 IMPLANT
WATER STERILE IRR 3000ML UROMA (IV SOLUTION) ×3 IMPLANT
WATER STERILE IRR 500ML POUR (IV SOLUTION) ×3 IMPLANT

## 2013-05-17 NOTE — Anesthesia Procedure Notes (Signed)
Procedure Name: LMA Insertion Date/Time: 05/17/2013 8:41 AM Performed by: Mechele Claude Pre-anesthesia Checklist: Patient identified, Emergency Drugs available, Suction available and Patient being monitored Patient Re-evaluated:Patient Re-evaluated prior to inductionOxygen Delivery Method: Circle System Utilized Preoxygenation: Pre-oxygenation with 100% oxygen Intubation Type: IV induction Ventilation: Mask ventilation without difficulty LMA: LMA inserted LMA Size: 4.0 Number of attempts: 1 Airway Equipment and Method: bite block Placement Confirmation: positive ETCO2 Tube secured with: Tape Dental Injury: Teeth and Oropharynx as per pre-operative assessment

## 2013-05-17 NOTE — Progress Notes (Signed)
05/17/2013  9:46 AM  PATIENT:  Tammie Marsh  53 y.o. female  PRE-OPERATIVE DIAGNOSIS:  CERVICAL CANCER  POST-OPERATIVE DIAGNOSIS:  CERVICAL CANCER  PROCEDURE:  Procedure(s): TANDEM RING PLACEMENT (N/A)  SURGEON:  Surgeon(s) and Role:    * Blair Promise, MD - Primary  PHYSICIAN ASSISTANT:   ASSISTANTS: none   ANESTHESIA:   general, LMA  EBL:  Total I/O In: 600 [I.V.:600] Out: - ebl 2-3 cc  BLOOD ADMINISTERED:none  DRAINS: Urinary Catheter (Foley)   LOCAL MEDICATIONS USED:  NONE  SPECIMEN:  No Specimen  DISPOSITION OF SPECIMEN:  N/A  COUNTS:  YES  TOURNIQUET:  * No tourniquets in log *  DICTATION: The patient was taken to the operating room and placed in the dorsolithotomy position. Patient was prepped and draped in the usual sterile fashion. A Foley catheter was placed without difficulty. The patient then proceeded to undergo examination under anesthesia. Compared to exam prior to starting treatment her cervical mass had decreased in size. There appeared to be involvement of the left parametria with limited mobility along the left side. The cervix was retracted slightly to the left. Estimated cervical size was approximately 4-5 cm. Patient then proceeded to undergo a uterine sounding and dilation of the cervix. This was performed with the assistance of intraoperative ultrasound. Prior to ultrasound the patient had approximately 200 cc of sterile water placed in the bladder. Good images were obtained however an accurate size estimate of the cervical mass could not be obtained. The uterus was moderately anteverted and sounded to approximately 6-7 cm. The cervix was dilated and then the patient had placement of a 45, a 60 mm tandem within the uterine cavity. Excellent placement was noted on intraoperative ultrasound. Patient then had a 30 mm ring/45 orientation placed at the cervix area.This ring was attached to the tandem. She then had a 45 rectal paddle placed. Sterile packing  soaked in Estrace cream was placed along the rectum as well as the region of the bladder. Patient tolerated the procedure well. She had minimal bleeding with the procedure. After she has recovered from anesthesia she will be transported to the radiation oncology department for planning and her second high-dose-rate treatment with iridium 192 as the high-dose-rate source.      PLAN OF CARE: Transfer to radiation oncology for planning and treatment  PATIENT DISPOSITION:  PACU - hemodynamically stable.   Delay start of Pharmacological VTE agent (>24hrs) due to surgical blood loss or risk of bleeding: not applicable

## 2013-05-17 NOTE — Progress Notes (Signed)
POST-ANESTHESIA   IMMEDIATELY FOLLOWING SURGERY: Do not drive or operate machinery for the first twenty four hours after surgery. Do not make any important decisions for twenty four hours after surgery or while taking narcotic pain medications or sedatives. If you develop intractable nausea and vomiting or a severe headache please notify your doctor immediately.   FOLLOW-UP: You do not need to follow up with anesthesia unless specifically instructed to do so.   WOUND CARE INSTRUCTIONS (if applicable): Expect some mild vaginal bleeding, but if large amount of bleeding occurs please contact Dr. Sondra Come at [phone] or the Radiation On-Call physician. Call for any fever greater than 101.0 degrees or increasing vaginal//abdominal pain or trouble urinating.   QUESTIONS?: Please feel free to call your physician or the hospital operator if you have any questions, and they will be happy to assist you.  Resume all medications: as listed on your after visit summary. Your next appointment is 05/22/13.

## 2013-05-17 NOTE — Interval H&P Note (Signed)
History and Physical Interval Note:  05/17/2013 8:41 AM  Tammie Marsh  has presented today for surgery, with the diagnosis of CERVICAL CANCER  The various methods of treatment have been discussed with the patient and family. After consideration of risks, benefits and other options for treatment, the patient has consented to  Procedure(s): TANDEM RING PLACEMENT (N/A) as a surgical intervention .  The patient's history has been reviewed, patient examined, no change in status, stable for surgery.  I have reviewed the patient's chart and labs.  Questions were answered to the patient's satisfaction.     Gery Pray D

## 2013-05-17 NOTE — Progress Notes (Signed)
Department of Radiation Oncology  Phone: [phone]  Fax: [phone]   Simple treatment device note   While in the operating room the patient had construction of her custom tandem/ring HDR treatment equipment. The optimal degree arrangement for the tandem/ring was a 45 angle. A 60 mm tandem was used with a 30 mm ring. The patient also had placement of a rectal paddle to reduce high-dose radiation to the rectum area.  -----------------------------------   Tammie Marsh D. Tobenna Needs, PhD, MD     

## 2013-05-17 NOTE — Progress Notes (Signed)
Patient rating pain at a 2/10.

## 2013-05-17 NOTE — Anesthesia Postprocedure Evaluation (Signed)
  Anesthesia Post-op Note  Patient: Tammie Marsh  Procedure(s) Performed: Procedure(s) (LRB): TANDEM RING PLACEMENT (N/A)  Patient Location: PACU  Anesthesia Type: General  Level of Consciousness: awake and alert   Airway and Oxygen Therapy: Patient Spontanous Breathing  Post-op Pain: mild  Post-op Assessment: Post-op Vital signs reviewed, Patient's Cardiovascular Status Stable, Respiratory Function Stable, Patent Airway and No signs of Nausea or vomiting  Last Vitals:  Filed Vitals:   05/17/13 0930  BP: 140/79  Pulse: 80  Temp:   Resp: 12    Post-op Vital Signs: stable   Complications: No apparent anesthesia complications

## 2013-05-17 NOTE — Progress Notes (Signed)
  Department of Radiation Oncology  Phone: [phone]  Fax: [phone]   Simulation note   Patient was brought down from the outpatient surgical center and placed on the CT scan her table. A rectal tube was placed with contrast instilled into the rectal vault. The patient's bladder catheter was accessed and contrast was placed in the bladder. Fiducial markers were placed within the tandem/green system. Patient proceeded to undergo CT scan through the pelvis area. The tandem was in good position within the endometrial cavity. The ring was closely approximated to the cervix. Patient will proceed with planning for her second high-dose-rate treatment. She will receive 5.5 gray.  -----------------------------------   Blair Promise, PhD, MD

## 2013-05-17 NOTE — Progress Notes (Signed)
Received report from [place], Therapist, sports.  Patient has LR infusing at 100 ml/hr through her left upper arm PICC line.  She has a foley catheter draining clear, yellow urine.  SCD's in place.  Transported to CT SIM with Nicki Reaper, Transporter.

## 2013-05-17 NOTE — Progress Notes (Signed)
Patient rating pelvic cramping at a 7/10.  Gave 1 mg dilaudid IV.  Will continue to monitor.

## 2013-05-17 NOTE — Transfer of Care (Signed)
Immediate Anesthesia Transfer of Care Note  Patient: Tammie Marsh  Procedure(s) Performed: Procedure(s) (LRB): TANDEM RING PLACEMENT (N/A)  Patient Location: PACU  Anesthesia Type: General  Level of Consciousness: awake, alert  and oriented  Airway & Oxygen Therapy: Patient Spontanous Breathing and Patient connected to face mask oxygen  Post-op Assessment: Report given to PACU RN and Post -op Vital signs reviewed and stable  Post vital signs: Reviewed and stable  Complications: No apparent anesthesia complications

## 2013-05-17 NOTE — H&P (View-Only) (Signed)
Radiation Oncology         [phone] ________________________________  Pre-operative note  Name: Tammie Marsh MRN: [id number]  Date: 05/05/2013  DOB: May 27, 1960  IP:[name], [name], DO  No ref. 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 female who is recently was diagnosed with advanced squamous cell carcinoma cervix.  On PET scan she was found to have lymphadenopathy within the pelvis and retroperitoneal area. Patient is currently receiving external beam radiation therapy at the [facility] in [place]. The patient has completed 32.4 gray. She is scheduled for her first brachytherapy procedure on January 15. She will undergo exam under anesthesia and placement of fiducial markers as well as a tandem/ring system for high-dose rate radiation therapy.    PREVIOUS RADIATION THERAPY: No  PAST MEDICAL HISTORY:  has a past medical history of Factor V Leiden mutation; Anxiety; Hypothyroidism; ASCUS on Pap smear; Ovarian cyst; History of pulmonary embolism; Cervical cancer (dx 04-06-2013  [facility]/  [facility] DR Sondra Come FOR BRACHYTHERAPY (HIGH DOSE RADIATION VIA TANDEM RING)); and MRSA (methicillin resistant staph aureus) culture positive.    PAST SURGICAL HISTORY: Past Surgical History  Procedure Laterality Date  . Cervical biopsy  03/17/2013  . Cervical conization w/bx  2009    FAMILY HISTORY: family history includes Stroke in her mother.  SOCIAL HISTORY:  reports that she has never smoked. She has never used smokeless tobacco. She reports that she does not drink alcohol or use illicit drugs.  ALLERGIES: Review of patient's allergies indicates no known allergies.  MEDICATIONS:  No current facility-administered medications for this  encounter.   Current Outpatient Prescriptions  Medication Sig Dispense Refill  . Biotin 10 MG TABS Take 10 mg by mouth daily.       . cholecalciferol (VITAMIN D) 1000 UNITS tablet Take 1,000 Units by mouth daily.      . clindamycin (CLEOCIN) 300 MG capsule Take 300 mg by mouth 3 (three) times daily.      . clonazePAM (KLONOPIN) 2 MG tablet Take 2 mg by mouth 2 (two) times daily.       . cyanocobalamin 1000 MCG tablet Take 100 mcg by mouth daily.      Marland Kitchen HYDROcodone-acetaminophen (NORCO/VICODIN) 5-325 MG per tablet Take 1 tablet by mouth every 6 (six) hours as needed for moderate pain.      Marland Kitchen levothyroxine (SYNTHROID, LEVOTHROID) 112 MCG tablet Take 112 mcg by mouth daily before breakfast.      . PARoxetine (PAXIL) 30 MG tablet Take 60 mg by mouth daily.       . Sulfamethoxazole-Trimethoprim (BACTRIM PO) Take 1 tablet by mouth 2 (two) times daily.      . vitamin E 400 UNIT capsule Take 400 Units by mouth daily.        REVIEW OF SYSTEMS:  A 15 point review of systems is documented in the electronic medical record. This was obtained by the nursing staff. However, I reviewed this with the patient to discuss relevant findings and make appropriate changes. She is tolerating her external beam and radiosensitizing chemotherapy well. Her vaginal bleeding has stopped at this time.   PHYSICAL EXAM:  height is 5\' 5"  (1.651 m) and weight is 136 lb (61.689 kg).   General Appearance:  Alert, cooperative, no distress,  appears stated age   Head:  Normocephalic, without obvious abnormality, atraumatic   Eyes:  PERRL, conjunctiva/corneas clear, EOM's intact   Ears:  Normal TM's and external ear canals, both ears   Nose:  Nares normal, septum midline, mucosa normal, no drainage or sinus tenderness   Throat:  Lips, mucosa, and tongue normal; teeth and gums normal   Neck:  Supple, symmetrical, trachea midline, no adenopathy;  thyroid: no enlargement/tenderness/nodules; no carotid  bruit or JVD   Back:   Symmetric, no curvature, ROM normal, no CVA tenderness   Lungs:  Clear to auscultation bilaterally, respirations unlabored   Chest Wall:  No tenderness or deformity   Heart:  Regular rate and rhythm, S1 and S2 normal, no murmur, rub or gallop      Abdomen:  Soft, non-tender, bowel sounds active all four quadrants,  no masses, no organomegaly   Genitalia:  Normal external genitalia, on speculum examination there is no bleeding noted in the vaginal vault. the cervix area has biopsy changes with no active bleeding. on bimanual examination the cervix was expanded to approximately 6 cm and quite firm with palpation. The cervix protrudes approximately 1/3 the way down the vaginal vault. The vaginal fornices appear intact. It is difficult to determine parametrial involvement. No obvious sidewall involvement   Rectal:  Normal tone, confirms bimanual exam   Extremities:  Extremities normal, atraumatic, no cyanosis or edema   Pulses:  2+ and symmetric all extremities   Skin:  Skin color, texture, turgor normal, no rashes or lesions   Lymph nodes:  Cervical, supraclavicular, and axillary nodes normal   Neurologic:  CNII-XII intact, normal strength, sensation and reflexes  throughout       LABORATORY DATA: Current labs will be faxed from the [facility] today   RADIOGRAPHY: Nm Pet Image Initial (pi) Skull Base To Thigh  04/14/2013   CLINICAL DATA:  Initial treatment strategy for cervical cancer.  EXAM: NUCLEAR MEDICINE PET SKULL BASE TO THIGH  FASTING BLOOD GLUCOSE:  Value: 101mg /dl  TECHNIQUE: 16.9 mCi F-18 FDG was injected intravenously. CT data was obtained and used for attenuation correction and anatomic localization only. (This was not acquired as a diagnostic CT examination.) Additional exam technical data entered on technologist worksheet.  COMPARISON:  CT scan 03/13/2013.  FINDINGS: NECK  No hypermetabolic lymph nodes in the neck.  CHEST  No hypermetabolic mediastinal or hilar nodes. No  suspicious pulmonary nodules on the CT scan.  ABDOMEN/PELVIS  The solid abdominal organs are unremarkable. No findings for metastatic disease. There is a large bulky cervical mass as demonstrated on the recent CT scan. This demonstrates marked FDG uptake with SUV max of 26.9. There is also an FDG positive (SUV max 6.4) 15 mm external iliac lymph node on the left and a smaller positive (3.1 SUV max) left perirectal lymph node. A small retroperitoneal lymph node located at the aortic bifurcation level is also weakly FDG positive (SUV max 3.2).  The right adnexal lesion does not show any FDG uptake and could be an exophytic fibroid or possibly a benign ovarian fibrothecoma. There are small cyst associated with both ovaries.  SKELETON  No focal hypermetabolic activity to suggest skeletal metastasis.  IMPRESSION: 1. Large cervical mass with neoplastic range FDG uptake (SUV max 26.9). 2. FDG positive pelvic and retroperitoneal lymph nodes. 3. The 4.5 cm right adnexal mass does not demonstrate abnormal FDG uptake and could be an exophytic fibroid or possibly an ovarian fibrothecoma.   Electronically Signed  By: Kalman Jewels M.D.   On: 04/14/2013 13:07      IMPRESSION: Advanced cervical cancer  PLAN: Patient will be taken to the operating room on January 15 for exam under anesthesia, placement of fiducial markers and a tandem ring system in preparation for high-dose rate radiation therapy. Patient will receive 5 weekly high-dose rate treatments. She will continue her external beam radiation therapy on weekdays she is not receiving brachytherapy treatments. ------------------------------------------------  -----------------------------------  Blair Promise, PhD, MD

## 2013-05-17 NOTE — Anesthesia Preprocedure Evaluation (Signed)
Anesthesia Evaluation  Patient identified by MRN, date of birth, ID band Patient awake  General Assessment Comment:Anesthesia Record         DOB: Nov 12, 1960       Final Anesthesia Type    Anesthesia Type:      Final Anesthesia Type: Tammie Marsh <[id number]>     Female     53 y.o. Current as of 05/11/13 0709    Height Weight BMI NPO Status    5\' 5"  (1.651 m)  (05/09/13) 135 lb (61.236 kg)  (05/11/13) 22.7  (05/09/13) 1700       Allergies ASA Status      No Known Allergies        III      Case Summary    Date: 05/11/13    Surgeon: Blair Promise, MD    Responsible Provider: Peyton Najjar, MD    Location: [facility] OR ROOM 4 / [place]    Procedure (Code)  EXAMINATION UNDER ANETHESIA, PLACEMENT OF GOLD MARKERS, PLACEMENT OF TANDEM RING FOR HIGH DOSE RADIATION THERAPY (N/A Vagina )      Diagnosis (Codes)  CERVICAL CANCER        Final Anesthesia Type    Anesthesia Type:      Final Anesthesia Type: General       Patient Diagnosis    Pre-op diagnosis: CERVICAL CANCER    Post-op diagnosis: CERVICAL CANCER      General Information    Date: 05/11/2013 Case classification: Scheduled Status: Posted    Location: [place] Room: [facility] OR ROOM 4 Service: Urology    Patient class: Ambulatory Surgery          Prescription Medications Within last 10 days from 05/11/13      Last Taken Last Updated    Biotin 10 MG TABS   05/10/2013 at Unknown time 05/11/13 0655    cholecalciferol (VITAMIN D) 1000 UNITS tablet   05/10/2013 at Unknown time 05/11/13 0655    clindamycin (CLEOCIN) 300 MG capsule   05/10/2013 at Unknown time 05/11/13 0655    clonazePAM (KLONOPIN) 2 MG tablet   05/11/2013 at 0530 05/11/13 0655    cyanocobalamin 1000 MCG tablet   05/10/2013 at Unknown time 05/11/13 0655    HYDROcodone-acetaminophen (NORCO/VICODIN) 5-325 MG per tablet   05/10/2013 at Unknown time 05/11/13 0655   levothyroxine (SYNTHROID, LEVOTHROID) 112 MCG tablet   05/11/2013 at 0530 05/11/13 0655    PARoxetine (PAXIL) 30 MG tablet   05/11/2013 at 0530 05/11/13 0655    Sulfamethoxazole-Trimethoprim (BACTRIM PO)   05/10/2013 at Unknown time 05/11/13 0655    vitamin E 400 UNIT capsule   05/10/2013 at Unknown time 05/11/13 0655    ibuprofen (ADVIL,MOTRIN) 800 MG tablet (Discontinued)   Taking 04/11/13 1434      Events    Date Time Event    05/11/13 0735 Anesthesia Start      [place] Start Data Collection      [phone] Patient re-evaluated        Patient re-evaluated prior to induction.        [place] Induction      0740 LMA Inserted      6948 LMA Removed      0840 Emergence      0841 Stop Data Collection      0845 Handoff        I completed my SBAR handoff to the receiving nurse in  the PACU.        [phone] Anesthesia Stop      Intraprocedure Grid/Graph           Staff Responsible times on 05/11/13    Name Role [place], [place], CRNA CRNA [phone]      Lines, Drains, and Airways    Type Details Placement Removal    PICC / Midline Single Lumen Yes; PICC; Left; Taped 05/11/13 0720      Urethral Catheter 05/11/13; 0745; Tammie Marsh; Latex; 16 Fr. 05/11/13 0745 by Shana Chute        Case Tracking Events    Event Time In    Anesthesia Start Thu May 11, 2013 [id number]    Procedure Start Time Thu May 11, 2013 [id number]    Procedure End Thu May 11, 2013 [id number]    Patient Al Decant May 11, 2013 [id number]    In Recovery Thu May 11, 2013 0845    Anesthesia Renaldo Fiddler May 11, 2013 [id number]    Out of Recovery Thu May 11, 2013 1004        Procedure Notes    Last edited 05/11/13 [id number] by Bonney Aid, CRNA    Procedure Name: LMA Insertion Date/Time: 05/11/2013 7:40 AM Performed by: Bonney Aid Pre-anesthesia Checklist: Patient identified, Emergency Drugs available, Suction available and Patient being monitored Patient  Re-evaluated:Patient Re-evaluated prior to inductionOxygen Delivery Method: Circle System Utilized Preoxygenation: Pre-oxygenation with 100% oxygen Intubation Type: IV induction Ventilation: Mask ventilation without difficulty LMA: LMA inserted LMA Size: 4.0 Number of attempts: 1 Airway Equipment and Method: bite block Placement Confirmation: positive ETCO2 Dental Injury: Teeth and Oropharynx as per pre-operative assessment               Electronically signed by Bonney Aid, CRNA at 05/11/2013  7:52 AM      Attestation Information    Staff Name Date Time Type    Oletta Lamas Tompkins-[place]sey 05/11/13 [id number] Time Out    Oletta Lamas Tompkins-[place]sey 05/11/13 [id number] Time Out    Peyton Najjar, MD 05/11/13 0739 Present at Induction    Peyton Najjar, MD 05/11/13 [phone] Present at Emergence    Izora Gala L Tompkins-Kersey 05/11/13 0847 Intra-Op      Pre Signoff    Anesthesia Ready marked on 05/11/13 at [place] by Peyton Najjar, MD.    05/11/13 at [phone] by Peyton Najjar, MD    05/11/13 at [phone] by Bonney Aid, CRNA    05/10/13 at 1621 by Peyton Najjar, MD        Last Written Preprocedure Note    Last edited 05/11/13 0714 by Peyton Najjar, MD                                      Anesthesia Evaluation  Patient identified by MRN, date of birth, ID band Patient awake     Reviewed: Allergy & Precautions, H&P , NPO status , Patient's Chart, lab work & pertinent test results    Airway Mallampati: II TM Distance: >3 FB Neck ROM: full   Dental (+) Caps and Dental Advisory Given Cap lower front:    Pulmonary neg pulmonary ROS,  History PE breath sounds clear to auscultation Pulmonary exam normal       Cardiovascular Exercise Tolerance:  Good negative cardio ROS   Rhythm:regular Rate:Normal     Neuro/Psych Anxiety negative neurological ROS negative psych ROS     GI/Hepatic negative GI ROS, Neg liver ROS,    Endo/Other   negative endocrine ROSHypothyroidism     Renal/GU negative Renal ROS  negative genitourinary    Musculoskeletal   Abdominal    Peds   Hematology (+) Blood dyscrasia, , Factor V Leiden mutation    Anesthesia Other Findings   Reproductive/Obstetrics negative OB ROS Cervical cancer                                             Anesthesia Physical Anesthesia Plan   ASA: III   Anesthesia Plan: General    Post-op Pain Management:    Induction: Intravenous   Airway Management Planned: LMA   Additional Equipment:    Intra-op Plan:    Post-operative Plan:    Informed Consent: I have reviewed the patients History and Physical, chart, labs and discussed the procedure including the risks, benefits and alternatives for the proposed anesthesia with the patient or authorized representative who has indicated his/her understanding and acceptance.    Dental Advisory Given   Plan Discussed with: CRNA and Surgeon   Anesthesia Plan Comments:            Anesthesia Quick Evaluation            Electronically signed by Peyton Najjar, MD at 05/10/2013  4:21 PM Electronically signed by Peyton Najjar, MD at 05/11/2013  7:14 AM      Anesthesia History    History Date Comments History Date Comments    No specialty history recorded    Other Medical History    Factor V Leiden mutation   Anxiety   Hypothyroidism      ASCUS on Pap smear   Ovarian cyst   History of pulmonary embolism      Cervical cancer dx 04-06-2013  [facility]/  [facility] DR Sondra Come FOR BRACHYTHERAPY (HIGH DOSE RADIATION VIA TANDEM RING) MRSA (methicillin resistant staph aureus) culture positive            Anesthesia Family History    Problem Relations (Age of Onset)    Problems found, but filtered away        Substance History    Smoking Status: Never Smoker     Smokeless Tobacco Status: Never Used    Alcohol use: No    Drug use: No      Blood Orders  Ordered in last 14 days.    No blood orders found      Obstetric History as of 05/12/2013    The patient has not been asked about pregnancy.      Surgical History    CERVICAL BIOPSY CERVICAL CONIZATION W/BX      Problem List    Hx pulmonary embolism Hypothyroidism    Fibroids Anxiety    Cervical cancer        NPO Status    NPO: 1700 Verified: 05/11/13 0655 by Kem [place], RN    Liquids: [id number] Verified: 05/11/13 0655 by Kem Boroughs, RN    Solids: 1700 Verified: 05/11/13 0655 by Kem Boroughs, RN         Last Menstrual Period - Last Recorded    LMP  04/14/2013                Medication Given    midazolam (VERSED) injection 1 mg/mL (mg) 2 mg Given 05/11/13 0735 Bonney Aid, CRNA      fentaNYL injection (mcg) 50 mcg Given 05/11/13 0757 Bonney Aid, CRNA        50 mcg Given   0828 Bonney Aid, CRNA      lidocaine (cardiac) injection 2 % (mg) 80 mg Given 05/11/13 0739 Bonney Aid, CRNA      propofol (DIPRIVAN) BOLUS only (mg) 150 mg Given 05/11/13 0739 Bonney Aid, CRNA      dexamethasone (DECADRON) injection 4 mg/mL (mg) 10 mg Given 05/11/13 2025 Bonney Aid, CRNA      ondansetron [facility]) injection 4 mg/2 ml (mg) 4 mg Given 05/11/13 0826 Bonney Aid, CRNA      ketorolac (TORADOL) injection 30 mg (mg) 30 mg Given 05/11/13 4270 Bonney Aid, CRNA      glycopyrrolate (ROBINUL) injection (mg) 0.2 mg Given 05/11/13 0759 Bonney Aid, CRNA      acetaminophen (mg) 1,000 mg (over 30 min) Given 05/11/13 0749 Bonney Aid, CRNA      lactated ringers infusion (mL) 100 mL Anesthesia Volume Adjustment 05/11/13 0735 Bonney Aid, CRNA      Dosing weight:  61.7 kg 50 mL Anesthesia Volume Adjustment   [place], CRNA                 Intraop Assessment      05/11/2013 0735 05/11/2013 0739 05/11/2013 0745 05/11/2013 0800    EKG: SB - - -    Head Assessment: - Face Checked Face  Checked Face Checked    Forced Air Warming Device: - - 43 -                   05/11/2013 0815 05/11/2013 0830        EKG: - -        Head Assessment: Face Checked Face Checked        Forced Air Warming Device: - -                       Post-Induction      05/11/2013 0739          Eye care: Tape Applied                   CV    No data found.      L and D    No data found.      Positioning      05/11/2013 0735 05/11/2013 0742 05/11/2013 0751      Positions: Supine Position;Right arm secured on padded support <90 degrees abduction;Left arm secured on padded suport <90 degrees abduction Lithotomy Position;Right arm secured on padded support <90 degrees abduction;Left arm secured on padded suport <90 degrees abduction;Patient Placed in Stirrups, Legs Padded Trendelenburg Position      Checklist: Head and Neck Neutral Head and Neck Neutral -                     Pre-Induction      05/11/2013 0704 05/11/2013 0735 05/11/2013 0743      Anesthesia Checklist:: Anesthesia Machine & Monitor/Equipment Checked, Alarms on & functioning;Suction Available, Checked & Functioning;Airway Equipment Checked and Available;Anesthesia and Resusciatation Drugs Available;Difficult Airway Cart & Equipment Available;Anesthesia Machine Number: - -  [id number]          Patient Self Position: - Patient Moved Self to OR Table -      Monitors Applied: - Anesthesia Monitors/Equipment Applied -      NIBP Site: - Arm Upper  R -      Cardiac: - EKG -      EKG Leads: - II -      Monitors: - Pulse Oximeter Applied -      Forced Air Warming Device: - - Upper Body                     Emergence      05/11/2013 0840 05/11/2013 0841        Emergence Events: Spontaneous Respirations -        Airway Events : LMA Removed;Maintaining Airway -        LOC : Patient Drowsy Patient Drowsy        Transfer: - Transported to PACU;Transported with Oxygen via Nasal Cannula                         All Post Notes     Last edited 05/11/13 0946 by Peyton Najjar, MD      Anesthesia Post-op Note   Patient: Tammie Marsh   Procedure(s) Performed: Procedure(s) (LRB): EXAMINATION UNDER ANETHESIA, PLACEMENT OF GOLD MARKERS, PLACEMENT OF TANDEM RING FOR HIGH DOSE RADIATION THERAPY (N/A)   Patient Location: PACU   Anesthesia Type: General   Level of Consciousness: awake and alert    Airway and Oxygen Therapy: Patient Spontanous Breathing   Post-op Pain: mild   Post-op Assessment: Post-op Vital signs reviewed, Patient's Cardiovascular Status Stable, Respiratory Function Stable, Patent Airway and No signs of Nausea or vomiting   Last Vitals:  Filed Vitals:     05/11/13 0930   BP:  125/66   Pulse:  74   Temp:     Resp:  9      Post-op Vital Signs: stable     Complications: No apparent anesthesia complications      Follow-up notes    No postoperative notes have been written.      PACU Handoff Notes    Last edited 05/11/13 0846 by Bonney Aid, CRNA    Immediate Anesthesia Transfer of Care Note   Patient: Tammie Marsh   Procedure(s) Performed: Procedure(s): EXAMINATION UNDER ANETHESIA, PLACEMENT OF GOLD MARKERS, PLACEMENT OF TANDEM RING FOR HIGH DOSE RADIATION THERAPY (N/A)   Patient Location: PACU   Anesthesia Type:General   Level of Consciousness: sedated and responds to stimulation   Airway & Oxygen Therapy: Patient Spontanous Breathing and Patient connected to nasal cannula oxygen   Post-op Assessment: Report given to PACU RN   Post vital signs: Reviewed and stable   Complications: No apparent anesthesia complications      Pre Vitals     BP: 111/60 Pulse: 59 Resp: 16  SpO2: 100 Temp: 36 C    Ht: 5\' 5"  (1.651 m)  (05/09/13) Wt: 135 lb (61.236 kg)  (05/11/13)  BMI: 22.7 IBW: 125 lb 10.6 oz (57 kg)  Last edited 05/11/13 [id number] by DC        Post Vitals     BP: 121/78 Pulse: 96 Resp: 10  SpO2: 100 Temp: 36.7 C    Ht: 5\' 5"  (1.651 m)  (05/09/13)  Wt: 135 lb (61.236 kg)  (05/11/13)  BMI: 22.7 IBW: 125 lb 10.6 oz (  57 kg)  Last edited 05/11/13 0845 by AA            Go to the Billing and Compliance Report for this case.      Addendum Summary    No postoperative notes have been written.        Reviewed: Allergy & Precautions, H&P , NPO status , Patient's Chart, lab work & pertinent test results  Airway Mallampati: II TM Distance: >3 FB Neck ROM: Full    Dental no notable dental hx.    Pulmonary neg pulmonary ROS,  breath sounds clear to auscultation  Pulmonary exam normal       Cardiovascular negative cardio ROS  Rhythm:Regular Rate:Normal     Neuro/Psych negative neurological ROS  negative psych ROS   GI/Hepatic negative GI ROS, Neg liver ROS,   Endo/Other  Hypothyroidism   Renal/GU negative Renal ROS  negative genitourinary   Musculoskeletal negative musculoskeletal ROS (+)   Abdominal   Peds negative pediatric ROS (+)  Hematology  (+) Blood dyscrasia, ,   Anesthesia Other Findings   Reproductive/Obstetrics negative OB ROS                           Anesthesia Physical Anesthesia Plan  ASA: III  Anesthesia Plan: General   Post-op Pain Management:    Induction: Intravenous  Airway Management Planned: LMA  Additional Equipment:   Intra-op Plan:   Post-operative Plan:   Informed Consent: I have reviewed the patients History and Physical, chart, labs and discussed the procedure including the risks, benefits and alternatives for the proposed anesthesia with the patient or authorized representative who has indicated his/her understanding and acceptance.   Dental advisory given  Plan Discussed with: CRNA and Surgeon  Anesthesia Plan Comments:         Anesthesia Quick Evaluation

## 2013-05-17 NOTE — Progress Notes (Signed)
Foley catheter removed by Tivis Ringer, RN.  Patient had 450 cc of clear, yellow urine in collection bag.  Val assisted Dr. Sondra Come with removal of tandem equipment. Patient tolerated well.

## 2013-05-17 NOTE — Progress Notes (Signed)
Department of Radiation Oncology  Phone: [phone]  Fax: [phone]   High-dose-rate brachytherapy procedure note    Verification simulation note   After planning was complete the patient was transferred to the high dose rate suite. Fiducial markers remained in place from her simulation. An AP and lateral film was obtained in the treatment position. This is compared to the patient's planning films earlier today documenting good position of the tandem/ring for treatment.   High-dose-rate brachytherapy procedure   The ring tandem system was attached to the high dose rate afterloading unit by catheter system. The patient proceeded to undergo her second high-dose-rate treatment directed at the cervical region. The patient was prescribed a dose of 5.5 gray. This was achieved with a total dwell time of 535.4 seconds. Patient was treated with two channels using 12 dwell positions in the tandem and 11 dwell positions within the ring system. Patient tolerated the procedure well. After completion of her therapy a radiation survey was performed documenting return of the iridium source into the Nucletron safe period.  -----------------------------------

## 2013-05-18 ENCOUNTER — Encounter (HOSPITAL_BASED_OUTPATIENT_CLINIC_OR_DEPARTMENT_OTHER): Payer: Self-pay | Admitting: Radiation Oncology

## 2013-05-18 ENCOUNTER — Telehealth: Payer: Self-pay | Admitting: Gynecologic Oncology

## 2013-05-18 ENCOUNTER — Encounter: Payer: Self-pay | Admitting: Radiation Oncology

## 2013-05-18 NOTE — Progress Notes (Addendum)
NPO AFTER MN. ARRIVE AT 0600. PT IS HAVING LAB WORK DONE AT [facility] ON Friday 05-19-2013, RESULTS TO BE FAXED. WILL TAKE SYNTHROID, KLONOPIN, AND PAXIL AM DOS W/ SIPS OF WATER.  PT HAS PICC LINE, WHICH WAS ACCESSED FOR IV PREVIOUS SURGERY'S HERE.

## 2013-05-18 NOTE — Progress Notes (Signed)
Per Darlena patient had left a message for a call. I called and left a message.

## 2013-05-19 ENCOUNTER — Other Ambulatory Visit: Payer: Self-pay | Admitting: Radiation Oncology

## 2013-05-19 DIAGNOSIS — C539 Malignant neoplasm of cervix uteri, unspecified: Secondary | ICD-10-CM

## 2013-05-22 ENCOUNTER — Ambulatory Visit
Admission: RE | Admit: 2013-05-22 | Discharge: 2013-05-22 | Disposition: A | Discharge status: Home / Self Care | Payer: Managed Care, Other (non HMO) | Source: Ambulatory Visit | Attending: Radiation Oncology | Admitting: Radiation Oncology

## 2013-05-22 ENCOUNTER — Ambulatory Visit (HOSPITAL_BASED_OUTPATIENT_CLINIC_OR_DEPARTMENT_OTHER): Payer: Managed Care, Other (non HMO) | Admitting: Anesthesiology

## 2013-05-22 ENCOUNTER — Ambulatory Visit (HOSPITAL_COMMUNITY): Payer: Managed Care, Other (non HMO)

## 2013-05-22 ENCOUNTER — Encounter (HOSPITAL_BASED_OUTPATIENT_CLINIC_OR_DEPARTMENT_OTHER)
Admission: RE | Disposition: A | Discharge status: Home / Self Care | Payer: Self-pay | Source: Ambulatory Visit | Attending: Radiation Oncology

## 2013-05-22 ENCOUNTER — Ambulatory Visit (HOSPITAL_COMMUNITY)
Admission: RE | Admit: 2013-05-22 | Discharge: 2013-05-22 | Disposition: A | Discharge status: Home / Self Care | Payer: Managed Care, Other (non HMO) | Source: Ambulatory Visit | Attending: Radiation Oncology | Admitting: Radiation Oncology

## 2013-05-22 ENCOUNTER — Encounter (HOSPITAL_BASED_OUTPATIENT_CLINIC_OR_DEPARTMENT_OTHER): Payer: Managed Care, Other (non HMO) | Admitting: Anesthesiology

## 2013-05-22 ENCOUNTER — Ambulatory Visit (HOSPITAL_BASED_OUTPATIENT_CLINIC_OR_DEPARTMENT_OTHER)
Admission: RE | Admit: 2013-05-22 | Discharge: 2013-05-22 | Disposition: A | Discharge status: Home / Self Care | Payer: Managed Care, Other (non HMO) | Source: Ambulatory Visit | Attending: Radiation Oncology | Admitting: Radiation Oncology

## 2013-05-22 ENCOUNTER — Encounter (HOSPITAL_BASED_OUTPATIENT_CLINIC_OR_DEPARTMENT_OTHER): Payer: Self-pay | Admitting: *Deleted

## 2013-05-22 VITALS — BP 127/78 | HR 70 | Temp 97.5°F

## 2013-05-22 DIAGNOSIS — C539 Malignant neoplasm of cervix uteri, unspecified: Secondary | ICD-10-CM

## 2013-05-22 DIAGNOSIS — D6859 Other primary thrombophilia: Secondary | ICD-10-CM | POA: Insufficient documentation

## 2013-05-22 DIAGNOSIS — Z86711 Personal history of pulmonary embolism: Secondary | ICD-10-CM | POA: Insufficient documentation

## 2013-05-22 DIAGNOSIS — E039 Hypothyroidism, unspecified: Secondary | ICD-10-CM | POA: Insufficient documentation

## 2013-05-22 DIAGNOSIS — Z79899 Other long term (current) drug therapy: Secondary | ICD-10-CM | POA: Insufficient documentation

## 2013-05-22 HISTORY — PX: TANDEM RING INSERTION: SHX6199

## 2013-05-22 SURGERY — INSERTION, UTERINE TANDEM AND RING OR CYLINDER, FOR BRACHYTHERAPY
Anesthesia: General | Site: Vagina

## 2013-05-22 MED ORDER — HYDROMORPHONE HCL PF 1 MG/ML IJ SOLN
1.0000 mg | Freq: Once | INTRAMUSCULAR | Status: AC
  Administered 2013-05-22: 1 mg via INTRAVENOUS
  Filled 2013-05-22: qty 1

## 2013-05-22 MED ORDER — MIDAZOLAM HCL 2 MG/2ML IJ SOLN
INTRAMUSCULAR | Status: AC
  Filled 2013-05-22: qty 2

## 2013-05-22 MED ORDER — MEPERIDINE HCL 25 MG/ML IJ SOLN
6.2500 mg | INTRAMUSCULAR | Status: DC | PRN
  Filled 2013-05-22: qty 1

## 2013-05-22 MED ORDER — HEPARIN SOD (PORK) LOCK FLUSH 100 UNIT/ML IV SOLN
500.0000 [IU] | Freq: Once | INTRAVENOUS | Status: AC
  Administered 2013-05-22: 500 [IU] via INTRAVENOUS

## 2013-05-22 MED ORDER — PROMETHAZINE HCL 25 MG/ML IJ SOLN
INTRAMUSCULAR | Status: AC
  Filled 2013-05-22: qty 1

## 2013-05-22 MED ORDER — HYDROMORPHONE HCL PF 4 MG/ML IJ SOLN: 1.0000 mg | Freq: Once | INTRAMUSCULAR | Status: DC

## 2013-05-22 MED ORDER — GLYCOPYRROLATE 0.2 MG/ML IJ SOLN
INTRAMUSCULAR | Status: DC | PRN
  Administered 2013-05-22: 0.2 mg via INTRAVENOUS

## 2013-05-22 MED ORDER — KETOROLAC TROMETHAMINE 30 MG/ML IJ SOLN
INTRAMUSCULAR | Status: DC | PRN
  Administered 2013-05-22: 30 mg via INTRAVENOUS

## 2013-05-22 MED ORDER — SODIUM CHLORIDE 0.9 % IV SOLN
INTRAVENOUS | Status: DC
  Filled 2013-05-22: qty 1000

## 2013-05-22 MED ORDER — ESTRADIOL 0.1 MG/GM VA CREA
TOPICAL_CREAM | VAGINAL | Status: DC | PRN
  Administered 2013-05-22: 1 via VAGINAL

## 2013-05-22 MED ORDER — MIDAZOLAM HCL 5 MG/5ML IJ SOLN
INTRAMUSCULAR | Status: DC | PRN
  Administered 2013-05-22: 2 mg via INTRAVENOUS

## 2013-05-22 MED ORDER — LACTATED RINGERS IV SOLN
INTRAVENOUS | Status: DC
  Filled 2013-05-22: qty 1000

## 2013-05-22 MED ORDER — PROMETHAZINE HCL 25 MG/ML IJ SOLN
6.2500 mg | INTRAMUSCULAR | Status: DC | PRN
  Administered 2013-05-22: 6.25 mg via INTRAVENOUS
  Filled 2013-05-22: qty 1

## 2013-05-22 MED ORDER — FENTANYL CITRATE 0.05 MG/ML IJ SOLN
INTRAMUSCULAR | Status: AC
  Filled 2013-05-22: qty 2

## 2013-05-22 MED ORDER — LACTATED RINGERS IV SOLN
INTRAVENOUS | Status: DC
  Administered 2013-05-22: 07:00:00 via INTRAVENOUS
  Filled 2013-05-22: qty 1000

## 2013-05-22 MED ORDER — WATER FOR IRRIGATION, STERILE IR SOLN
Status: DC | PRN
  Administered 2013-05-22: 3000 mL

## 2013-05-22 MED ORDER — PROPOFOL 10 MG/ML IV BOLUS
INTRAVENOUS | Status: DC | PRN
  Administered 2013-05-22: 160 mg via INTRAVENOUS

## 2013-05-22 MED ORDER — ACETAMINOPHEN 10 MG/ML IV SOLN
INTRAVENOUS | Status: DC | PRN
  Administered 2013-05-22: 1000 mg via INTRAVENOUS

## 2013-05-22 MED ORDER — LIDOCAINE HCL (CARDIAC) 20 MG/ML IV SOLN
INTRAVENOUS | Status: DC | PRN
  Administered 2013-05-22: 60 mg via INTRAVENOUS

## 2013-05-22 MED ORDER — FENTANYL CITRATE 0.05 MG/ML IJ SOLN
INTRAMUSCULAR | Status: AC
  Filled 2013-05-22: qty 6

## 2013-05-22 MED ORDER — FENTANYL CITRATE 0.05 MG/ML IJ SOLN
INTRAMUSCULAR | Status: DC | PRN
  Administered 2013-05-22: 50 ug via INTRAVENOUS
  Administered 2013-05-22: 25 ug via INTRAVENOUS
  Administered 2013-05-22: 50 ug via INTRAVENOUS
  Administered 2013-05-22: 25 ug via INTRAVENOUS

## 2013-05-22 MED ORDER — FENTANYL CITRATE 0.05 MG/ML IJ SOLN
25.0000 ug | INTRAMUSCULAR | Status: DC | PRN
  Administered 2013-05-22: 50 ug via INTRAVENOUS
  Filled 2013-05-22: qty 1

## 2013-05-22 MED ORDER — DEXAMETHASONE SODIUM PHOSPHATE 4 MG/ML IJ SOLN
INTRAMUSCULAR | Status: DC | PRN
  Administered 2013-05-22: 10 mg via INTRAVENOUS

## 2013-05-22 MED ORDER — ONDANSETRON HCL 4 MG/2ML IJ SOLN
INTRAMUSCULAR | Status: DC | PRN
  Administered 2013-05-22: 4 mg via INTRAVENOUS

## 2013-05-22 MED ORDER — SODIUM CHLORIDE 0.9 % IJ SOLN
10.0000 mL | Freq: Once | INTRAMUSCULAR | Status: AC
  Administered 2013-05-22: 10 mL via INTRAVENOUS

## 2013-05-22 SURGICAL SUPPLY — 36 items
BAG URINE DRAINAGE (UROLOGICAL SUPPLIES) ×3 IMPLANT
BNDG CONFORM 2 STRL LF (GAUZE/BANDAGES/DRESSINGS) IMPLANT
CATH FOLEY 2WAY SLVR  5CC 16FR (CATHETERS) ×2
CATH FOLEY 2WAY SLVR 5CC 16FR (CATHETERS) ×1 IMPLANT
CLOTH BEACON ORANGE TIMEOUT ST (SAFETY) ×3 IMPLANT
COVER TABLE BACK 60X90 (DRAPES) ×3 IMPLANT
DRAPE LG THREE QUARTER DISP (DRAPES) ×3 IMPLANT
DRAPE UNDERBUTTOCKS STRL (DRAPE) ×3 IMPLANT
DRSG PAD ABDOMINAL 8X10 ST (GAUZE/BANDAGES/DRESSINGS) ×3 IMPLANT
GLOVE BIO SURGEON STRL SZ7.5 (GLOVE) ×3 IMPLANT
GLOVE BIOGEL M 6.5 STRL (GLOVE) ×6 IMPLANT
GLOVE BIOGEL PI IND STRL 7.5 (GLOVE) ×1 IMPLANT
GLOVE BIOGEL PI INDICATOR 7.5 (GLOVE) ×2
GOWN PREVENTION PLUS LG XLONG (DISPOSABLE) IMPLANT
GOWN STRL REUS W/TWL LRG LVL3 (GOWN DISPOSABLE) ×3 IMPLANT
GOWN STRL REUS W/TWL XL LVL3 (GOWN DISPOSABLE) ×3 IMPLANT
HOLDER FOLEY CATH W/STRAP (MISCELLANEOUS) ×3 IMPLANT
LEGGING LITHOTOMY PAIR STRL (DRAPES) ×3 IMPLANT
NEEDLE SPNL 22GX3.5 QUINCKE BK (NEEDLE) IMPLANT
PACK BASIN DAY SURGERY FS (CUSTOM PROCEDURE TRAY) ×3 IMPLANT
PACKING VAGINAL (PACKING) IMPLANT
PAD ABD 8X10 STRL (GAUZE/BANDAGES/DRESSINGS) ×3 IMPLANT
PAD OB MATERNITY 4.3X12.25 (PERSONAL CARE ITEMS) ×3 IMPLANT
PAD PREP 24X48 CUFFED NSTRL (MISCELLANEOUS) ×3 IMPLANT
PLUG CATH AND CAP STER (CATHETERS) ×3 IMPLANT
SET IRRIG Y TYPE TUR BLADDER L (SET/KITS/TRAYS/PACK) ×3 IMPLANT
SUT PROLENE 0 SH 30 (SUTURE) IMPLANT
SUT SILK 2 0 30  PSL (SUTURE)
SUT SILK 2 0 30 PSL (SUTURE) IMPLANT
SYR BULB IRRIGATION 50ML (SYRINGE) IMPLANT
SYR CONTROL 10ML LL (SYRINGE) IMPLANT
SYRINGE 10CC LL (SYRINGE) ×3 IMPLANT
TOWEL OR 17X24 6PK STRL BLUE (TOWEL DISPOSABLE) ×3 IMPLANT
TRAY DSU PREP LF (CUSTOM PROCEDURE TRAY) ×3 IMPLANT
WATER STERILE IRR 3000ML UROMA (IV SOLUTION) ×3 IMPLANT
WATER STERILE IRR 500ML POUR (IV SOLUTION) ×3 IMPLANT

## 2013-05-22 NOTE — Anesthesia Procedure Notes (Signed)
Procedure Name: LMA Insertion Date/Time: 05/22/2013 7:39 AM Performed by: Mechele Claude Pre-anesthesia Checklist: Patient identified, Emergency Drugs available, Suction available and Patient being monitored Patient Re-evaluated:Patient Re-evaluated prior to inductionOxygen Delivery Method: Circle System Utilized Preoxygenation: Pre-oxygenation with 100% oxygen Intubation Type: IV induction Ventilation: Mask ventilation without difficulty LMA: LMA inserted LMA Size: 4.0 Number of attempts: 1 Airway Equipment and Method: bite block Placement Confirmation: positive ETCO2 Tube secured with: Tape Dental Injury: Teeth and Oropharynx as per pre-operative assessment

## 2013-05-22 NOTE — Progress Notes (Signed)
POST-ANESTHESIA   IMMEDIATELY FOLLOWING SURGERY: Do not drive or operate machinery for the first twenty four hours after surgery. Do not make any important decisions for twenty four hours after surgery or while taking narcotic pain medications or sedatives. If you develop intractable nausea and vomiting or a severe headache please notify your doctor immediately.   FOLLOW-UP: You do not need to follow up with anesthesia unless specifically instructed to do so.   WOUND CARE INSTRUCTIONS (if applicable): Expect some mild vaginal bleeding, but if large amount of bleeding occurs please contact Dr. Kinard at [phone] or the Radiation On-Call physician. Call for any fever greater than 101.0 degrees or increasing vaginal//abdominal pain or trouble urinating.   QUESTIONS?: Please feel free to call your physician or the hospital operator if you have any questions, and they will be happy to assist you. Resume all medications: as listed on your after visit summary.     

## 2013-05-22 NOTE — Progress Notes (Signed)
05/22/2013  8:36 AM  PATIENT:  Tammie Marsh  53 y.o. female  PRE-OPERATIVE DIAGNOSIS:  CERVICAL CANCER  POST-OPERATIVE DIAGNOSIS:  CERVICAL CANCER  PROCEDURE:  Procedure(s): TANDEM RING PLACEMENT (N/A)  SURGEON:  Surgeon(s) and Role:    * Blair Promise, MD - Primary  PHYSICIAN ASSISTANT:   ASSISTANTS: none   ANESTHESIA:   general  EBL:  Total I/O In: 500 [I.V.:500] Out: 100 [Urine:100], EBL 2-3 cc  BLOOD ADMINISTERED:none  DRAINS: Urinary Catheter (Foley)   LOCAL MEDICATIONS USED:  NONE  SPECIMEN:  No Specimen  DISPOSITION OF SPECIMEN:  N/A  COUNTS:  YES  TOURNIQUET:  * No tourniquets in log *  DICTATION: The patient was taken to the operating room and placed in the dorsolithotomy position. Patient was prepped and draped in the usual sterile fashion. A Foley catheter was placed without difficulty. The patient then proceeded to undergo examination under anesthesia. Compared to exam prior to starting treatment her cervical mass had decreased in size. There appeared to be involvement of the left parametria with limited mobility along the left side. Cervix and parametrial movement on exam seems to be better compared to last week.  The cervix was retracted slightly to the left. Estimated cervical size was approximately 4 cm. Patient then proceeded to undergo a uterine sounding and dilation of the cervix. This was performed with the assistance of intraoperative ultrasound. Prior to ultrasound the patient had approximately 200 cc of sterile water placed in the bladder. Good images were obtained however an accurate size estimate of the cervical mass could not be obtained. The uterus was moderately anteverted and sounded to approximately 6-7 cm. The cervix was dilated and then the patient had placement of a 45, a 60 mm tandem within the uterine cavity. Excellent placement was noted on intraoperative ultrasound. Patient then had a 30 mm ring/45 orientation placed at the cervix  area.This ring was attached to the tandem. She then had a 45 rectal paddle placed. Sterile packing soaked in Estrace cream was placed along the rectum as well as the region of the bladder. More attention to the anterior bladder packing was given to the lower dose to the bladder region.   Patient tolerated the procedure well. She had minimal bleeding with the procedure. After she has recovered from anesthesia she will be transported to the radiation oncology department for planning and her third high-dose-rate treatment with iridium 192 as the high-dose-rate source.      PLAN OF CARE: Transfer to radiation oncology for planning and treatment  PATIENT DISPOSITION:  PACU - hemodynamically stable.   Delay start of Pharmacological VTE agent (>24hrs) due to surgical blood loss or risk of bleeding: not applicablegg

## 2013-05-22 NOTE — Progress Notes (Signed)
Assisted Dr. Sondra Come with removal of Tandem equipment.  Patient tolerated well.  Removed foley catheter intact.  700 cc clear, yellow urine noted in collection bag.  Patient's left upper arm picc flushed with 10 cc normal saline and 250 units heparin. The port was covered by a 2x2 and secured with tape.  Patient given discharge paperwork and escorted to waiting room by [facility], Transporter.

## 2013-05-22 NOTE — Progress Notes (Signed)
Received report from [place], Therapist, sports.  Patient has LR running in at gravity into her left upper arm picc.  She has a foley catheter draining clear, yellow urine.  She denies pain.  Transported to CT SIM with Nicki Reaper, Transporter.

## 2013-05-22 NOTE — Progress Notes (Signed)
  Department of Radiation Oncology  Phone: [phone]  Fax: [phone]   Simulation note   Patient was brought down from the outpatient surgical center and placed on the CT scan her table. A rectal tube was placed with contrast instilled into the rectal vault. The patient's bladder catheter was accessed and contrast was placed in the bladder. Fiducial markers were placed within the tandem/ring system. Patient proceeded to undergo CT scan through the pelvis area. The tandem was in good position within the endometrial cavity. The ring was closely approximated to the cervix. Patient will proceed with planning for her third high-dose-rate treatment. She will receive 5.5 gray.  -----------------------------------   Blair Promise, PhD, MD

## 2013-05-22 NOTE — Transfer of Care (Addendum)
Immediate Anesthesia Transfer of Care Note  Patient: Tammie Marsh  Procedure(s) Performed: Procedure(s) (LRB): TANDEM RING PLACEMENT (N/A)  Patient Location: PACU  Anesthesia Type: General  Level of Consciousness: drowsy, follows commands  Airway & Oxygen Therapy: Patient Spontanous Breathing and Patient connected to nasal cannula oxygen  Post-op Assessment: Report given to PACU RN and Post -op Vital signs reviewed and stable  Post vital signs: Reviewed and stable  Complications: No apparent anesthesia complications

## 2013-05-22 NOTE — Progress Notes (Signed)
Patient rating pain at a 10/10 due to pelvic cramping.  SCD's in place.  Call light in reach.

## 2013-05-22 NOTE — OR Nursing (Signed)
PATIENT ARRIVED TO OR WITH NO FOLEY CATHETER IN PLACE.

## 2013-05-22 NOTE — H&P (View-Only) (Signed)
Radiation Oncology         [phone] ________________________________  Pre-operative note  Name: Tammie Marsh MRN: [id number]  Date: 05/05/2013  DOB: May 27, 1960  IP:[name], [name], DO  No ref. 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 female who is recently was diagnosed with advanced squamous cell carcinoma cervix.  On PET scan she was found to have lymphadenopathy within the pelvis and retroperitoneal area. Patient is currently receiving external beam radiation therapy at the [facility] in [place]. The patient has completed 32.4 gray. She is scheduled for her first brachytherapy procedure on January 15. She will undergo exam under anesthesia and placement of fiducial markers as well as a tandem/ring system for high-dose rate radiation therapy.    PREVIOUS RADIATION THERAPY: No  PAST MEDICAL HISTORY:  has a past medical history of Factor V Leiden mutation; Anxiety; Hypothyroidism; ASCUS on Pap smear; Ovarian cyst; History of pulmonary embolism; Cervical cancer (dx 04-06-2013  [facility]/  [facility] DR Sondra Come FOR BRACHYTHERAPY (HIGH DOSE RADIATION VIA TANDEM RING)); and MRSA (methicillin resistant staph aureus) culture positive.    PAST SURGICAL HISTORY: Past Surgical History  Procedure Laterality Date  . Cervical biopsy  03/17/2013  . Cervical conization w/bx  2009    FAMILY HISTORY: family history includes Stroke in her mother.  SOCIAL HISTORY:  reports that she has never smoked. She has never used smokeless tobacco. She reports that she does not drink alcohol or use illicit drugs.  ALLERGIES: Review of patient's allergies indicates no known allergies.  MEDICATIONS:  No current facility-administered medications for this  encounter.   Current Outpatient Prescriptions  Medication Sig Dispense Refill  . Biotin 10 MG TABS Take 10 mg by mouth daily.       . cholecalciferol (VITAMIN D) 1000 UNITS tablet Take 1,000 Units by mouth daily.      . clindamycin (CLEOCIN) 300 MG capsule Take 300 mg by mouth 3 (three) times daily.      . clonazePAM (KLONOPIN) 2 MG tablet Take 2 mg by mouth 2 (two) times daily.       . cyanocobalamin 1000 MCG tablet Take 100 mcg by mouth daily.      Marland Kitchen HYDROcodone-acetaminophen (NORCO/VICODIN) 5-325 MG per tablet Take 1 tablet by mouth every 6 (six) hours as needed for moderate pain.      Marland Kitchen levothyroxine (SYNTHROID, LEVOTHROID) 112 MCG tablet Take 112 mcg by mouth daily before breakfast.      . PARoxetine (PAXIL) 30 MG tablet Take 60 mg by mouth daily.       . Sulfamethoxazole-Trimethoprim (BACTRIM PO) Take 1 tablet by mouth 2 (two) times daily.      . vitamin E 400 UNIT capsule Take 400 Units by mouth daily.        REVIEW OF SYSTEMS:  A 15 point review of systems is documented in the electronic medical record. This was obtained by the nursing staff. However, I reviewed this with the patient to discuss relevant findings and make appropriate changes. She is tolerating her external beam and radiosensitizing chemotherapy well. Her vaginal bleeding has stopped at this time.   PHYSICAL EXAM:  height is 5\' 5"  (1.651 m) and weight is 136 lb (61.689 kg).   General Appearance:  Alert, cooperative, no distress,  appears stated age   Head:  Normocephalic, without obvious abnormality, atraumatic   Eyes:  PERRL, conjunctiva/corneas clear, EOM's intact   Ears:  Normal TM's and external ear canals, both ears   Nose:  Nares normal, septum midline, mucosa normal, no drainage or sinus tenderness   Throat:  Lips, mucosa, and tongue normal; teeth and gums normal   Neck:  Supple, symmetrical, trachea midline, no adenopathy;  thyroid: no enlargement/tenderness/nodules; no carotid  bruit or JVD   Back:   Symmetric, no curvature, ROM normal, no CVA tenderness   Lungs:  Clear to auscultation bilaterally, respirations unlabored   Chest Wall:  No tenderness or deformity   Heart:  Regular rate and rhythm, S1 and S2 normal, no murmur, rub or gallop      Abdomen:  Soft, non-tender, bowel sounds active all four quadrants,  no masses, no organomegaly   Genitalia:  Normal external genitalia, on speculum examination there is no bleeding noted in the vaginal vault. the cervix area has biopsy changes with no active bleeding. on bimanual examination the cervix was expanded to approximately 6 cm and quite firm with palpation. The cervix protrudes approximately 1/3 the way down the vaginal vault. The vaginal fornices appear intact. It is difficult to determine parametrial involvement. No obvious sidewall involvement   Rectal:  Normal tone, confirms bimanual exam   Extremities:  Extremities normal, atraumatic, no cyanosis or edema   Pulses:  2+ and symmetric all extremities   Skin:  Skin color, texture, turgor normal, no rashes or lesions   Lymph nodes:  Cervical, supraclavicular, and axillary nodes normal   Neurologic:  CNII-XII intact, normal strength, sensation and reflexes  throughout       LABORATORY DATA: Current labs will be faxed from the [facility] today   RADIOGRAPHY: Nm Pet Image Initial (pi) Skull Base To Thigh  04/14/2013   CLINICAL DATA:  Initial treatment strategy for cervical cancer.  EXAM: NUCLEAR MEDICINE PET SKULL BASE TO THIGH  FASTING BLOOD GLUCOSE:  Value: 101mg /dl  TECHNIQUE: 16.9 mCi F-18 FDG was injected intravenously. CT data was obtained and used for attenuation correction and anatomic localization only. (This was not acquired as a diagnostic CT examination.) Additional exam technical data entered on technologist worksheet.  COMPARISON:  CT scan 03/13/2013.  FINDINGS: NECK  No hypermetabolic lymph nodes in the neck.  CHEST  No hypermetabolic mediastinal or hilar nodes. No  suspicious pulmonary nodules on the CT scan.  ABDOMEN/PELVIS  The solid abdominal organs are unremarkable. No findings for metastatic disease. There is a large bulky cervical mass as demonstrated on the recent CT scan. This demonstrates marked FDG uptake with SUV max of 26.9. There is also an FDG positive (SUV max 6.4) 15 mm external iliac lymph node on the left and a smaller positive (3.1 SUV max) left perirectal lymph node. A small retroperitoneal lymph node located at the aortic bifurcation level is also weakly FDG positive (SUV max 3.2).  The right adnexal lesion does not show any FDG uptake and could be an exophytic fibroid or possibly a benign ovarian fibrothecoma. There are small cyst associated with both ovaries.  SKELETON  No focal hypermetabolic activity to suggest skeletal metastasis.  IMPRESSION: 1. Large cervical mass with neoplastic range FDG uptake (SUV max 26.9). 2. FDG positive pelvic and retroperitoneal lymph nodes. 3. The 4.5 cm right adnexal mass does not demonstrate abnormal FDG uptake and could be an exophytic fibroid or possibly an ovarian fibrothecoma.   Electronically Signed  By: Kalman Jewels M.D.   On: 04/14/2013 13:07      IMPRESSION: Advanced cervical cancer  PLAN: Patient will be taken to the operating room on January 15 for exam under anesthesia, placement of fiducial markers and a tandem ring system in preparation for high-dose rate radiation therapy. Patient will receive 5 weekly high-dose rate treatments. She will continue her external beam radiation therapy on weekdays she is not receiving brachytherapy treatments. ------------------------------------------------  -----------------------------------  Blair Promise, PhD, MD

## 2013-05-22 NOTE — Interval H&P Note (Signed)
History and Physical Interval Note:  05/22/2013 7:37 AM  Tammie Marsh  has presented today for surgery, with the diagnosis of CERVICAL CANCER  The various methods of treatment have been discussed with the patient and family. After consideration of risks, benefits and other options for treatment, the patient has consented to  Procedure(s): TANDEM RING PLACEMENT (N/A) as a surgical intervention .  The patient's history has been reviewed, patient examined, no change in status, stable for surgery.  I have reviewed the patient's chart and labs.  Questions were answered to the patient's satisfaction.     Gery Pray D

## 2013-05-22 NOTE — Progress Notes (Signed)
Department of Radiation Oncology  Phone: [phone]  Fax: [phone]   Simple treatment device note   While in the operating room the patient had construction of her custom tandem/ring HDR treatment equipment. The optimal degree arrangement for the tandem/ring was a 45 angle. A 60 mm tandem was used with a 30 mm ring. The patient also had placement of a rectal paddle to reduce high-dose radiation to the rectum area.  -----------------------------------   Blair Promise, PhD, MD

## 2013-05-22 NOTE — Anesthesia Preprocedure Evaluation (Signed)
Anesthesia Evaluation  Patient identified by MRN, date of birth, ID band Patient awake  General Assessment Comment:Anesthesia Record         DOB: [birth year]       Final Anesthesia Type    Anesthesia Type:      Final Anesthesia Type: Tammie Marsh, Tammie Marsh [id number]     Female     53 y.o. Current as of 05/11/13 0709    Height Weight BMI NPO Status    5\' 5"  (1.651 m)  (05/09/13) 135 lb (61.236 kg)  (05/11/13) 22.7  (05/09/13) 1700       Allergies ASA Status      No Known Allergies        III      Case Summary    Date: 05/11/13    Surgeon: Blair Promise, MD    Responsible Provider: Peyton Najjar, MD    Location: [facility] OR ROOM 4 / [place]    Procedure (Code)  EXAMINATION UNDER ANETHESIA, PLACEMENT OF GOLD MARKERS, PLACEMENT OF TANDEM RING FOR HIGH DOSE RADIATION THERAPY (N/A Vagina )      Diagnosis (Codes)  CERVICAL CANCER        Final Anesthesia Type    Anesthesia Type:      Final Anesthesia Type: General       Patient Diagnosis    Pre-op diagnosis: CERVICAL CANCER    Post-op diagnosis: CERVICAL CANCER      General Information    Date: 05/11/2013 Case classification: Scheduled Status: Posted    Location: [place] Room: [facility] OR ROOM 4 Service: Urology    Patient class: Ambulatory Surgery          Prescription Medications Within last 10 days from 05/11/13      Last Taken Last Updated    Biotin 10 MG TABS   05/10/2013 at Unknown time 05/11/13 0655    cholecalciferol (VITAMIN D) 1000 UNITS tablet   05/10/2013 at Unknown time 05/11/13 0655    clindamycin (CLEOCIN) 300 MG capsule   05/10/2013 at Unknown time 05/11/13 0655    clonazePAM (KLONOPIN) 2 MG tablet   05/11/2013 at 0530 05/11/13 0655    cyanocobalamin 1000 MCG tablet   05/10/2013 at Unknown time 05/11/13 0655    HYDROcodone-acetaminophen (NORCO/VICODIN) 5-325 MG per tablet   05/10/2013 at Unknown time 05/11/13 0655     levothyroxine (SYNTHROID, LEVOTHROID) 112 MCG tablet   05/11/2013 at 0530 05/11/13 0655    PARoxetine (PAXIL) 30 MG tablet   05/11/2013 at 0530 05/11/13 0655    Sulfamethoxazole-Trimethoprim (BACTRIM PO)   05/10/2013 at Unknown time 05/11/13 0655    vitamin E 400 UNIT capsule   05/10/2013 at Unknown time 05/11/13 0655    ibuprofen (ADVIL,MOTRIN) 800 MG tablet (Discontinued)   Taking 04/11/13 1434      Events    Date Time Event    05/11/13 0735 Anesthesia Start      [place]illiant Start Data Collection      [phone] Patient re-evaluated        Patient re-evaluated prior to induction.        Dale Induction      0740 LMA Inserted      [id number] LMA Removed      0840 Emergence      0841 Stop Data Collection      0845 Handoff        I completed my SBAR handoff to the receiving  nurse in the PACU.        [phone] Anesthesia Stop      Intraprocedure Grid/Graph           Staff Responsible times on 05/11/13    Name Role [place], [place], CRNA CRNA [phone]      Lines, Drains, and Airways    Type Details Placement Removal    PICC / Midline Single Lumen Yes; PICC; Left; Taped 05/11/13 0720      Urethral Catheter 05/11/13; 0745; Verlon Setting, nancy; Latex; 16 Fr. 05/11/13 0745 by Shana Chute        Case Tracking Events    Event Time In    Anesthesia Start Thu May 11, 2013 0932    Procedure Start Time Thu May 11, 2013 3557    Procedure End Thu May 11, 2013 3220    Patient Al Decant May 11, 2013 2542    In Recovery Thu May 11, 2013 0845    Anesthesia Renaldo Fiddler May 11, 2013 7062    Out of Recovery Thu May 11, 2013 1004        Procedure Notes    Last edited 05/11/13 3762 by Bonney Aid, CRNA    Procedure Name: LMA Insertion Date/Time: 05/11/2013 7:40 AM Performed by: Bonney Aid Pre-anesthesia Checklist: Patient identified, Emergency Drugs available, Suction available and Patient being monitored Patient  Re-evaluated:Patient Re-evaluated prior to inductionOxygen Delivery Method: Circle System Utilized Preoxygenation: Pre-oxygenation with 100% oxygen Intubation Type: IV induction Ventilation: Mask ventilation without difficulty LMA: LMA inserted LMA Size: 4.0 Number of attempts: 1 Airway Equipment and Method: bite block Placement Confirmation: positive ETCO2 Dental Injury: Teeth and Oropharynx as per pre-operative assessment               Electronically signed by Bonney Aid, CRNA at 05/11/2013  7:52 AM      Attestation Information    Staff Name Date Time Type    Oletta Lamas Tompkins-Kersey 05/11/13 8315 Time Out    Oletta Lamas Tompkins-Kersey 05/11/13 1761 Time Out    Peyton Najjar, MD 05/11/13 0739 Present at Induction    Peyton Najjar, MD 05/11/13 [phone] Present at Emergence    Izora Gala L Tompkins-Kersey 05/11/13 0847 Intra-Op      Pre Signoff    Anesthesia Ready marked on 05/11/13 at [place] by Peyton Najjar, MD.    05/11/13 at [phone] by Peyton Najjar, MD    05/11/13 at [phone] by Bonney Aid, CRNA    05/10/13 at 1621 by Peyton Najjar, MD        Last Written Preprocedure Note    Last edited 05/11/13 0714 by Peyton Najjar, MD                                      Anesthesia Evaluation  Patient identified by MRN, date of birth, ID band Patient awake     Reviewed: Allergy & Precautions, H&P , NPO status , Patient's Chart, lab work & pertinent test results    Airway Mallampati: II TM Distance: >3 FB Neck ROM: full   Dental (+) Caps and Dental Advisory Given Cap lower front:    Pulmonary neg pulmonary ROS,  History PE breath sounds clear to auscultation Pulmonary exam normal       Cardiovascular  Exercise Tolerance: Good negative cardio ROS   Rhythm:regular Rate:Normal     Neuro/Psych Anxiety negative neurological ROS negative psych ROS     GI/Hepatic negative GI ROS, Neg liver ROS,    Endo/Other   negative endocrine ROSHypothyroidism     Renal/GU negative Renal ROS  negative genitourinary    Musculoskeletal   Abdominal    Peds   Hematology (+) Blood dyscrasia, , Factor V Leiden mutation    Anesthesia Other Findings   Reproductive/Obstetrics negative OB ROS Cervical cancer                                             Anesthesia Physical Anesthesia Plan   ASA: III   Anesthesia Plan: General    Post-op Pain Management:    Induction: Intravenous   Airway Management Planned: LMA   Additional Equipment:    Intra-op Plan:    Post-operative Plan:    Informed Consent: I have reviewed the patients History and Physical, chart, labs and discussed the procedure including the risks, benefits and alternatives for the proposed anesthesia with the patient or authorized representative who has indicated his/her understanding and acceptance.    Dental Advisory Given   Plan Discussed with: CRNA and Surgeon   Anesthesia Plan Comments:            Anesthesia Quick Evaluation            Electronically signed by Gaetano Hawthorne, MD at 05/10/2013  4:21 PM Electronically signed by Gaetano Hawthorne, MD at 05/11/2013  7:14 AM      Anesthesia History    History Date Comments History Date Comments    No specialty history recorded    Other Medical History    Factor V Leiden mutation   Anxiety   Hypothyroidism      ASCUS on Pap smear   Ovarian cyst   History of pulmonary embolism      Cervical cancer dx 04-06-2013  [facility]  [facility] DR Roselind Messier FOR BRACHYTHERAPY (HIGH DOSE RADIATION VIA TANDEM RING) MRSA (methicillin resistant staph aureus) culture positive            Anesthesia Family History    Problem Relations (Age of Onset)    Problems found, but filtered away        Substance History    Smoking Status: Never Smoker     Smokeless Tobacco Status: Never Used    Alcohol use: No    Drug use: No      Blood Orders  Ordered in last 14 days.    No blood orders found      Obstetric History as of 05/12/2013    The patient has not been asked about pregnancy.      Surgical History    CERVICAL BIOPSY CERVICAL CONIZATION W/BX      Problem List    Hx pulmonary embolism Hypothyroidism    Fibroids Anxiety    Cervical cancer        NPO Status    NPO: 1700 Verified: 05/11/13 0655 by Jackquline Bosch, RN    Liquids: 2300 Verified: 05/11/13 0655 by Jackquline Bosch, RN    Solids: 1700 Verified: 05/11/13 0655 by Jackquline Bosch, RN         Last Menstrual Period - Last Recorded    LMP  04/14/2013                Medication Given    midazolam (VERSED) injection 1 mg/mL (mg) 2 mg Given 05/11/13 0735 Bonney Aid, CRNA      fentaNYL injection (mcg) 50 mcg Given 05/11/13 0757 Bonney Aid, CRNA        50 mcg Given   0828 Bonney Aid, CRNA      lidocaine (cardiac) injection 2 % (mg) 80 mg Given 05/11/13 0739 Bonney Aid, CRNA      propofol (DIPRIVAN) BOLUS only (mg) 150 mg Given 05/11/13 0739 Bonney Aid, CRNA      dexamethasone (DECADRON) injection 4 mg/mL (mg) 10 mg Given 05/11/13 2025 Bonney Aid, CRNA      ondansetron [facility]) injection 4 mg/2 ml (mg) 4 mg Given 05/11/13 0826 Bonney Aid, CRNA      ketorolac (TORADOL) injection 30 mg (mg) 30 mg Given 05/11/13 4270 Bonney Aid, CRNA      glycopyrrolate (ROBINUL) injection (mg) 0.2 mg Given 05/11/13 0759 Bonney Aid, CRNA      acetaminophen (mg) 1,000 mg (over 30 min) Given 05/11/13 0749 Bonney Aid, CRNA      lactated ringers infusion (mL) 100 mL Anesthesia Volume Adjustment 05/11/13 0735 Bonney Aid, CRNA      Dosing weight:  61.7 kg 50 mL Anesthesia Volume Adjustment   [place], CRNA                 Intraop Assessment      05/11/2013 0735 05/11/2013 0739 05/11/2013 0745 05/11/2013 0800    EKG: SB - - -    Head Assessment: - Face Checked Face  Checked Face Checked    Forced Air Warming Device: - - 43 -                   05/11/2013 0815 05/11/2013 0830        EKG: - -        Head Assessment: Face Checked Face Checked        Forced Air Warming Device: - -                       Post-Induction      05/11/2013 0739          Eye care: Tape Applied                   CV    No data found.      L and D    No data found.      Positioning      05/11/2013 0735 05/11/2013 0742 05/11/2013 0751      Positions: Supine Position;Right arm secured on padded support <90 degrees abduction;Left arm secured on padded suport <90 degrees abduction Lithotomy Position;Right arm secured on padded support <90 degrees abduction;Left arm secured on padded suport <90 degrees abduction;Patient Placed in Stirrups, Legs Padded Trendelenburg Position      Checklist: Head and Neck Neutral Head and Neck Neutral -                     Pre-Induction      05/11/2013 0704 05/11/2013 0735 05/11/2013 0743      Anesthesia Checklist:: Anesthesia Machine & Monitor/Equipment Checked, Alarms on & functioning;Suction Available, Checked & Functioning;Airway Equipment Checked and Available;Anesthesia and Resusciatation Drugs Available;Difficult Airway Cart & Equipment Available;Anesthesia Machine Number: - -  [id number]          Patient Self Position: - Patient Moved Self to OR Table -      Monitors Applied: - Anesthesia Monitors/Equipment Applied -      NIBP Site: - Arm Upper  R -      Cardiac: - EKG -      EKG Leads: - II -      Monitors: - Pulse Oximeter Applied -      Forced Air Warming Device: - - Upper Body                     Emergence      05/11/2013 0840 05/11/2013 0841        Emergence Events: Spontaneous Respirations -        Airway Events : LMA Removed;Maintaining Airway -        LOC : Patient Drowsy Patient Drowsy        Transfer: - Transported to PACU;Transported with Oxygen via Nasal Cannula                         All Post Notes     Last edited 05/11/13 0946 by Peyton Najjar, MD      Anesthesia Post-op Note   Patient: Tammie Marsh   Procedure(s) Performed: Procedure(s) (LRB): EXAMINATION UNDER ANETHESIA, PLACEMENT OF GOLD MARKERS, PLACEMENT OF TANDEM RING FOR HIGH DOSE RADIATION THERAPY (N/A)   Patient Location: PACU   Anesthesia Type: General   Level of Consciousness: awake and alert    Airway and Oxygen Therapy: Patient Spontanous Breathing   Post-op Pain: mild   Post-op Assessment: Post-op Vital signs reviewed, Patient's Cardiovascular Status Stable, Respiratory Function Stable, Patent Airway and No signs of Nausea or vomiting   Last Vitals:  Filed Vitals:     05/11/13 0930   BP:  125/66   Pulse:  74   Temp:     Resp:  9      Post-op Vital Signs: stable     Complications: No apparent anesthesia complications      Follow-up notes    No postoperative notes have been written.      PACU Handoff Notes    Last edited 05/11/13 0846 by Bonney Aid, CRNA    Immediate Anesthesia Transfer of Care Note   Patient: Tammie Marsh   Procedure(s) Performed: Procedure(s): EXAMINATION UNDER ANETHESIA, PLACEMENT OF GOLD MARKERS, PLACEMENT OF TANDEM RING FOR HIGH DOSE RADIATION THERAPY (N/A)   Patient Location: PACU   Anesthesia Type:General   Level of Consciousness: sedated and responds to stimulation   Airway & Oxygen Therapy: Patient Spontanous Breathing and Patient connected to nasal cannula oxygen   Post-op Assessment: Report given to PACU RN   Post vital signs: Reviewed and stable   Complications: No apparent anesthesia complications      Pre Vitals     BP: 111/60 Pulse: 59 Resp: 16  SpO2: 100 Temp: 36 C    Ht: 5\' 5"  (1.651 m)  (05/09/13) Wt: 135 lb (61.236 kg)  (05/11/13)  BMI: 22.7 IBW: 125 lb 10.6 oz (57 kg)  Last edited 05/11/13 [id number] by DC        Post Vitals     BP: 121/78 Pulse: 96 Resp: 10  SpO2: 100 Temp: 36.7 C    Ht: 5\' 5"  (1.651 m)  (05/09/13)  Wt: 135 lb (61.236 kg)  (05/11/13)  BMI: 22.7 IBW: 125 lb 10.6 oz (  57 kg)  Last edited 05/11/13 0845 by AA            Go to the Billing and Compliance Report for this case.      Addendum Summary    No postoperative notes have been written.        Reviewed: Allergy & Precautions, H&P , NPO status , Patient's Chart, lab work & pertinent test results  Airway Mallampati: II TM Distance: >3 FB Neck ROM: Full    Dental no notable dental hx.    Pulmonary neg pulmonary ROS, PE breath sounds clear to auscultation  Pulmonary exam normal       Cardiovascular negative cardio ROS  Rhythm:Regular Rate:Normal     Neuro/Psych negative neurological ROS  negative psych ROS   GI/Hepatic negative GI ROS, Neg liver ROS,   Endo/Other  Hypothyroidism   Renal/GU negative Renal ROS  negative genitourinary   Musculoskeletal negative musculoskeletal ROS (+)   Abdominal   Peds negative pediatric ROS (+)  Hematology  (+) Blood dyscrasia, , Factor V Leiden mutation   Anesthesia Other Findings   Reproductive/Obstetrics negative OB ROS                           Anesthesia Physical Anesthesia Plan  ASA: III  Anesthesia Plan: General   Post-op Pain Management:    Induction: Intravenous  Airway Management Planned: LMA  Additional Equipment:   Intra-op Plan:   Post-operative Plan:   Informed Consent: I have reviewed the patients History and Physical, chart, labs and discussed the procedure including the risks, benefits and alternatives for the proposed anesthesia with the patient or authorized representative who has indicated his/her understanding and acceptance.   Dental advisory given  Plan Discussed with: CRNA and Surgeon  Anesthesia Plan Comments:         Anesthesia Quick Evaluation

## 2013-05-22 NOTE — Progress Notes (Signed)
Patient rating pain at a 3 after receiving dilaudid IV.

## 2013-05-22 NOTE — Anesthesia Postprocedure Evaluation (Signed)
  Anesthesia Post-op Note  Patient: Tammie Marsh  Procedure(s) Performed: Procedure(s) (LRB): TANDEM RING PLACEMENT (N/A)  Patient Location: PACU  Anesthesia Type: General  Level of Consciousness: awake and alert   Airway and Oxygen Therapy: Patient Spontanous Breathing  Post-op Pain: mild  Post-op Assessment: Post-op Vital signs reviewed, Patient's Cardiovascular Status Stable, Respiratory Function Stable, Patent Airway and No signs of Nausea or vomiting  Last Vitals:  Filed Vitals:   05/22/13 0900  BP: 122/74  Pulse: 81  Temp:   Resp: 12    Post-op Vital Signs: stable   Complications: No apparent anesthesia complications

## 2013-05-22 NOTE — Progress Notes (Signed)
Department of Radiation Oncology  Phone: [phone]  Fax: [phone]    High-dose-rate brachytherapy procedure note    Verification simulation note   After planning was complete the patient was transferred to the high dose rate suite. Fiducial markers remained in place from her simulation. An AP and lateral film was obtained in the treatment position. This is compared to the patient's planning films earlier today documenting good position of the tandem/ring for treatment.   High-dose-rate brachytherapy procedure   The ring tandem system was attached to the high dose rate afterloading unit by catheter system. The patient proceeded to undergo her third high-dose-rate treatment directed at the cervical region. The patient was prescribed a dose of 5.5 gray. This was achieved with a total dwell time of 545.3 seconds. Patient was treated with two channels using 12 dwell positions in the tandem and 11 dwell positions within the ring system. Patient tolerated the procedure well. After completion of her therapy a radiation survey was performed documenting return of the iridium source into the Nucletron safe period.  -----------------------------------

## 2013-05-22 NOTE — Progress Notes (Signed)
Gave patient 1 mg of dilaudid IV per Dr. Sondra Come.  Patient tolerated well.  Will continue to monitor.

## 2013-05-23 ENCOUNTER — Other Ambulatory Visit: Payer: Self-pay | Admitting: Radiation Oncology

## 2013-05-23 ENCOUNTER — Other Ambulatory Visit (HOSPITAL_COMMUNITY): Payer: Managed Care, Other (non HMO)

## 2013-05-23 ENCOUNTER — Encounter: Payer: Self-pay | Admitting: Radiation Oncology

## 2013-05-23 ENCOUNTER — Encounter (HOSPITAL_BASED_OUTPATIENT_CLINIC_OR_DEPARTMENT_OTHER): Payer: Self-pay | Admitting: Radiation Oncology

## 2013-05-23 ENCOUNTER — Encounter (HOSPITAL_BASED_OUTPATIENT_CLINIC_OR_DEPARTMENT_OTHER): Payer: Self-pay

## 2013-05-23 ENCOUNTER — Ambulatory Visit (HOSPITAL_BASED_OUTPATIENT_CLINIC_OR_DEPARTMENT_OTHER): Admit: 2013-05-23 | Payer: Managed Care, Other (non HMO) | Admitting: Radiation Oncology

## 2013-05-23 SURGERY — INSERTION, UTERINE TANDEM AND RING OR CYLINDER, FOR BRACHYTHERAPY
Anesthesia: General

## 2013-05-23 NOTE — Telephone Encounter (Signed)
Patient with questions regarding the delay in making the diagnosis of cervical cancer.  Advised the patient to follow-up with her Gyn.

## 2013-05-24 ENCOUNTER — Encounter: Payer: Self-pay | Admitting: Radiation Oncology

## 2013-05-24 NOTE — Progress Notes (Signed)
Patient left a message again for call back. I called and had to leave a message again. Patient maybe inquiring about financial assist she will need to call patient acctg.

## 2013-05-26 ENCOUNTER — Encounter (HOSPITAL_BASED_OUTPATIENT_CLINIC_OR_DEPARTMENT_OTHER): Payer: Self-pay | Admitting: *Deleted

## 2013-05-26 NOTE — Progress Notes (Signed)
NPO AFTER MN. ARRIVE AT 0700. CURRENT LAB WORK WILL BE DONE AT [facility] AND RESULTS FAXED. WILL TAKE SYNTHROID, KLONOPIN, AND PAXIL AM DOS W/ SIPS OF WATER.  PT HAS PICC WHICH HAS BEEN USED WITH PREVIOUS VISIT'S AT THIS FACILITY.

## 2013-05-30 ENCOUNTER — Other Ambulatory Visit: Payer: Self-pay | Admitting: Radiation Oncology

## 2013-05-30 DIAGNOSIS — C539 Malignant neoplasm of cervix uteri, unspecified: Secondary | ICD-10-CM

## 2013-05-31 ENCOUNTER — Encounter (HOSPITAL_BASED_OUTPATIENT_CLINIC_OR_DEPARTMENT_OTHER): Payer: Self-pay

## 2013-05-31 ENCOUNTER — Ambulatory Visit (HOSPITAL_COMMUNITY): Payer: Managed Care, Other (non HMO)

## 2013-05-31 ENCOUNTER — Encounter (HOSPITAL_BASED_OUTPATIENT_CLINIC_OR_DEPARTMENT_OTHER): Payer: Managed Care, Other (non HMO) | Admitting: Anesthesiology

## 2013-05-31 ENCOUNTER — Encounter: Payer: Self-pay | Admitting: Radiation Oncology

## 2013-05-31 ENCOUNTER — Ambulatory Visit (HOSPITAL_BASED_OUTPATIENT_CLINIC_OR_DEPARTMENT_OTHER)
Admission: RE | Admit: 2013-05-31 | Discharge: 2013-05-31 | Disposition: A | Discharge status: Home / Self Care | Payer: Managed Care, Other (non HMO) | Source: Ambulatory Visit | Attending: Radiation Oncology | Admitting: Radiation Oncology

## 2013-05-31 ENCOUNTER — Ambulatory Visit (HOSPITAL_COMMUNITY)
Admission: RE | Admit: 2013-05-31 | Discharge: 2013-05-31 | Disposition: A | Discharge status: Home / Self Care | Payer: Managed Care, Other (non HMO) | Source: Ambulatory Visit | Attending: Radiation Oncology | Admitting: Radiation Oncology

## 2013-05-31 ENCOUNTER — Ambulatory Visit
Admission: RE | Admit: 2013-05-31 | Discharge: 2013-05-31 | Disposition: A | Discharge status: Home / Self Care | Payer: Managed Care, Other (non HMO) | Source: Ambulatory Visit | Attending: Radiation Oncology | Admitting: Radiation Oncology

## 2013-05-31 ENCOUNTER — Encounter (HOSPITAL_BASED_OUTPATIENT_CLINIC_OR_DEPARTMENT_OTHER)
Admission: RE | Disposition: A | Discharge status: Home / Self Care | Payer: Self-pay | Source: Ambulatory Visit | Attending: Radiation Oncology

## 2013-05-31 ENCOUNTER — Ambulatory Visit (HOSPITAL_BASED_OUTPATIENT_CLINIC_OR_DEPARTMENT_OTHER): Payer: Managed Care, Other (non HMO) | Admitting: Anesthesiology

## 2013-05-31 VITALS — BP 119/76 | HR 77

## 2013-05-31 DIAGNOSIS — C539 Malignant neoplasm of cervix uteri, unspecified: Secondary | ICD-10-CM

## 2013-05-31 DIAGNOSIS — Z79899 Other long term (current) drug therapy: Secondary | ICD-10-CM | POA: Insufficient documentation

## 2013-05-31 DIAGNOSIS — Z86711 Personal history of pulmonary embolism: Secondary | ICD-10-CM | POA: Insufficient documentation

## 2013-05-31 DIAGNOSIS — Z22322 Carrier or suspected carrier of Methicillin resistant Staphylococcus aureus: Secondary | ICD-10-CM | POA: Insufficient documentation

## 2013-05-31 DIAGNOSIS — E039 Hypothyroidism, unspecified: Secondary | ICD-10-CM | POA: Insufficient documentation

## 2013-05-31 DIAGNOSIS — R599 Enlarged lymph nodes, unspecified: Secondary | ICD-10-CM | POA: Insufficient documentation

## 2013-05-31 DIAGNOSIS — N83209 Unspecified ovarian cyst, unspecified side: Secondary | ICD-10-CM | POA: Insufficient documentation

## 2013-05-31 HISTORY — DX: Other complications of anesthesia, initial encounter: T88.59XA

## 2013-05-31 HISTORY — DX: Adverse effect of unspecified anesthetic, initial encounter: T41.45XA

## 2013-05-31 HISTORY — PX: TANDEM RING INSERTION: SHX6199

## 2013-05-31 HISTORY — DX: Other fatigue: R53.83

## 2013-05-31 SURGERY — INSERTION, UTERINE TANDEM AND RING OR CYLINDER, FOR BRACHYTHERAPY
Anesthesia: General | Site: Vagina

## 2013-05-31 MED ORDER — LIDOCAINE HCL (CARDIAC) 20 MG/ML IV SOLN
INTRAVENOUS | Status: DC | PRN
  Administered 2013-05-31: 40 mg via INTRAVENOUS

## 2013-05-31 MED ORDER — ESTRADIOL 0.1 MG/GM VA CREA
TOPICAL_CREAM | VAGINAL | Status: DC | PRN
  Administered 2013-05-31: 1 via VAGINAL

## 2013-05-31 MED ORDER — LACTATED RINGERS IV SOLN
INTRAVENOUS | Status: DC | PRN
  Administered 2013-05-31: 08:00:00 via INTRAVENOUS

## 2013-05-31 MED ORDER — MORPHINE SULFATE 4 MG/ML IJ SOLN
INTRAMUSCULAR | Status: AC
  Filled 2013-05-31: qty 1

## 2013-05-31 MED ORDER — HYDROMORPHONE HCL PF 1 MG/ML IJ SOLN
0.5000 mg | Freq: Once | INTRAMUSCULAR | Status: AC
  Administered 2013-05-31: 0.5 mg via INTRAVENOUS
  Filled 2013-05-31: qty 0.5

## 2013-05-31 MED ORDER — DEXAMETHASONE SODIUM PHOSPHATE 4 MG/ML IJ SOLN
INTRAMUSCULAR | Status: DC | PRN
  Administered 2013-05-31: 4 mg via INTRAVENOUS

## 2013-05-31 MED ORDER — PROMETHAZINE HCL 25 MG/ML IJ SOLN
6.2500 mg | INTRAMUSCULAR | Status: DC | PRN
  Filled 2013-05-31: qty 1

## 2013-05-31 MED ORDER — FENTANYL CITRATE 0.05 MG/ML IJ SOLN
INTRAMUSCULAR | Status: DC | PRN
  Administered 2013-05-31 (×3): 12.5 ug via INTRAVENOUS
  Administered 2013-05-31: 25 ug via INTRAVENOUS
  Administered 2013-05-31 (×2): 12.5 ug via INTRAVENOUS
  Administered 2013-05-31: 25 ug via INTRAVENOUS
  Administered 2013-05-31 (×4): 12.5 ug via INTRAVENOUS
  Administered 2013-05-31: 25 ug via INTRAVENOUS
  Administered 2013-05-31: 12.5 ug via INTRAVENOUS

## 2013-05-31 MED ORDER — MIDAZOLAM HCL 5 MG/5ML IJ SOLN
INTRAMUSCULAR | Status: DC | PRN
  Administered 2013-05-31 (×2): 1 mg via INTRAVENOUS

## 2013-05-31 MED ORDER — HYDROMORPHONE HCL PF 1 MG/ML IJ SOLN
INTRAMUSCULAR | Status: AC
  Filled 2013-05-31: qty 1

## 2013-05-31 MED ORDER — LACTATED RINGERS IV SOLN
INTRAVENOUS | Status: DC
  Administered 2013-05-31: 08:00:00 via INTRAVENOUS
  Filled 2013-05-31: qty 1000

## 2013-05-31 MED ORDER — STERILE WATER FOR IRRIGATION IR SOLN
Status: DC | PRN
  Administered 2013-05-31: 500 mL

## 2013-05-31 MED ORDER — LACTATED RINGERS IV SOLN
INTRAVENOUS | Status: DC
  Administered 2013-05-31: 10:00:00 via INTRAVENOUS
  Filled 2013-05-31: qty 1000

## 2013-05-31 MED ORDER — GLYCOPYRROLATE 0.2 MG/ML IJ SOLN
INTRAMUSCULAR | Status: DC | PRN
  Administered 2013-05-31: 0.2 mg via INTRAVENOUS

## 2013-05-31 MED ORDER — HYDROMORPHONE HCL PF 1 MG/ML IJ SOLN
1.0000 mg | Freq: Once | INTRAMUSCULAR | Status: AC
  Administered 2013-05-31: 1 mg via INTRAVENOUS
  Filled 2013-05-31: qty 1

## 2013-05-31 MED ORDER — KETOROLAC TROMETHAMINE 30 MG/ML IJ SOLN
INTRAMUSCULAR | Status: DC | PRN
  Administered 2013-05-31: 30 mg via INTRAVENOUS

## 2013-05-31 MED ORDER — PROPOFOL 10 MG/ML IV BOLUS
INTRAVENOUS | Status: DC | PRN
  Administered 2013-05-31: 200 mg via INTRAVENOUS

## 2013-05-31 MED ORDER — MIDAZOLAM HCL 2 MG/2ML IJ SOLN
INTRAMUSCULAR | Status: AC
  Filled 2013-05-31: qty 2

## 2013-05-31 MED ORDER — FENTANYL CITRATE 0.05 MG/ML IJ SOLN
INTRAMUSCULAR | Status: AC
  Filled 2013-05-31: qty 4

## 2013-05-31 MED ORDER — WATER FOR IRRIGATION, STERILE IR SOLN
Status: DC | PRN
  Administered 2013-05-31: 3000 mL via INTRAVESICAL

## 2013-05-31 MED ORDER — HYDROMORPHONE HCL PF 1 MG/ML IJ SOLN
0.5000 mg | INTRAMUSCULAR | Status: DC | PRN
  Administered 2013-05-31 (×3): 0.5 mg via INTRAVENOUS
  Filled 2013-05-31: qty 1

## 2013-05-31 MED ORDER — HEPARIN SOD (PORK) LOCK FLUSH 100 UNIT/ML IV SOLN
250.0000 [IU] | Freq: Once | INTRAVENOUS | Status: AC
  Administered 2013-05-31: 250 [IU] via INTRAVENOUS

## 2013-05-31 MED ORDER — MORPHINE SULFATE 2 MG/ML IJ SOLN
1.0000 mg | INTRAMUSCULAR | Status: DC | PRN
  Administered 2013-05-31 (×2): 2 mg via INTRAVENOUS
  Filled 2013-05-31: qty 1

## 2013-05-31 MED ORDER — ONDANSETRON HCL 4 MG/2ML IJ SOLN
INTRAMUSCULAR | Status: DC | PRN
  Administered 2013-05-31: 4 mg via INTRAVENOUS

## 2013-05-31 MED ORDER — SODIUM CHLORIDE 0.9 % IJ SOLN
10.0000 mL | Freq: Once | INTRAMUSCULAR | Status: AC
  Administered 2013-05-31: 10 mL via INTRAVENOUS

## 2013-05-31 SURGICAL SUPPLY — 37 items
BAG URINE DRAINAGE (UROLOGICAL SUPPLIES) ×2 IMPLANT
BNDG CONFORM 2 STRL LF (GAUZE/BANDAGES/DRESSINGS) IMPLANT
CATH FOLEY 2WAY SLVR  5CC 16FR (CATHETERS) ×1
CATH FOLEY 2WAY SLVR 5CC 16FR (CATHETERS) ×1 IMPLANT
CLOTH BEACON ORANGE TIMEOUT ST (SAFETY) ×2 IMPLANT
COVER TABLE BACK 60X90 (DRAPES) ×2 IMPLANT
DRAPE LG THREE QUARTER DISP (DRAPES) ×2 IMPLANT
DRAPE UNDERBUTTOCKS STRL (DRAPE) ×2 IMPLANT
GLOVE BIO SURGEON STRL SZ 6.5 (GLOVE) ×2 IMPLANT
GLOVE BIO SURGEON STRL SZ7.5 (GLOVE) ×4 IMPLANT
GLOVE INDICATOR 7.0 STRL GRN (GLOVE) ×2 IMPLANT
GLOVE INDICATOR 7.5 STRL GRN (GLOVE) ×2 IMPLANT
GOWN PREVENTION PLUS LG XLONG (DISPOSABLE) IMPLANT
GOWN STRL REUS W/ TWL LRG LVL3 (GOWN DISPOSABLE) ×1 IMPLANT
GOWN STRL REUS W/ TWL XL LVL3 (GOWN DISPOSABLE) ×1 IMPLANT
GOWN STRL REUS W/TWL LRG LVL3 (GOWN DISPOSABLE) ×1
GOWN STRL REUS W/TWL XL LVL3 (GOWN DISPOSABLE) ×1
HOLDER FOLEY CATH W/STRAP (MISCELLANEOUS) ×2 IMPLANT
LEGGING LITHOTOMY PAIR STRL (DRAPES) ×2 IMPLANT
NEEDLE SPNL 22GX3.5 QUINCKE BK (NEEDLE) IMPLANT
PACK BASIN DAY SURGERY FS (CUSTOM PROCEDURE TRAY) ×2 IMPLANT
PACKING VAGINAL (PACKING) ×2 IMPLANT
PAD ABD 8X10 STRL (GAUZE/BANDAGES/DRESSINGS) ×2 IMPLANT
PAD OB MATERNITY 4.3X12.25 (PERSONAL CARE ITEMS) ×2 IMPLANT
PAD PREP 24X48 CUFFED NSTRL (MISCELLANEOUS) ×2 IMPLANT
PLUG CATH AND CAP STER (CATHETERS) ×2 IMPLANT
SET IRRIG Y TYPE TUR BLADDER L (SET/KITS/TRAYS/PACK) ×2 IMPLANT
SUT PROLENE 0 SH 30 (SUTURE) IMPLANT
SUT SILK 2 0 30  PSL (SUTURE)
SUT SILK 2 0 30 PSL (SUTURE) IMPLANT
SYR BULB IRRIGATION 50ML (SYRINGE) IMPLANT
SYR CONTROL 10ML LL (SYRINGE) IMPLANT
SYRINGE 10CC LL (SYRINGE) ×2 IMPLANT
TOWEL OR 17X24 6PK STRL BLUE (TOWEL DISPOSABLE) ×4 IMPLANT
TRAY DSU PREP LF (CUSTOM PROCEDURE TRAY) ×2 IMPLANT
WATER STERILE IRR 3000ML UROMA (IV SOLUTION) ×2 IMPLANT
WATER STERILE IRR 500ML POUR (IV SOLUTION) ×2 IMPLANT

## 2013-05-31 NOTE — Interval H&P Note (Signed)
History and Physical Interval Note:  05/31/2013 8:33 AM  Tammie Marsh  has presented today for surgery, with the diagnosis of cervical cancer  The various methods of treatment have been discussed with the patient and family. After consideration of risks, benefits and other options for treatment, the patient has consented to  Procedure(s): TANDEM RING INSERTION (N/A) as a surgical intervention .  The patient's history has been reviewed, patient examined, no change in status, stable for surgery.  I have reviewed the patient's chart and labs.  Questions were answered to the patient's satisfaction.     Gery Pray D

## 2013-05-31 NOTE — Progress Notes (Signed)
      Department of Radiation Oncology  Phone: [phone]  Fax: [phone]   Simulation note   Patient was brought down from the outpatient surgical center and placed on the CT scan her table. A rectal tube was placed with contrast instilled into the rectal vault. The patient's bladder catheter was accessed and contrast was placed in the bladder. Fiducial markers were placed within the tandem/ring system. Patient proceeded to undergo CT scan through the pelvis area. The tandem was in good position within the endometrial cavity. The ring was closely approximated to the cervix. Patient will proceed with planning for her Fourth high-dose-rate treatment. She will receive 5.5 gray.  -----------------------------------   Blair Promise, PhD, MD

## 2013-05-31 NOTE — H&P (View-Only) (Signed)
Radiation Oncology         [phone] ________________________________  Pre-operative note  Name: Tammie Marsh MRN: [id number]  Date: 05/05/2013  DOB: May 27, 1960  IP:[name], [name], DO  No ref. 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 female who is recently was diagnosed with advanced squamous cell carcinoma cervix.  On PET scan she was found to have lymphadenopathy within the pelvis and retroperitoneal area. Patient is currently receiving external beam radiation therapy at the [facility] in [place]. The patient has completed 32.4 gray. She is scheduled for her first brachytherapy procedure on January 15. She will undergo exam under anesthesia and placement of fiducial markers as well as a tandem/ring system for high-dose rate radiation therapy.    PREVIOUS RADIATION THERAPY: No  PAST MEDICAL HISTORY:  has a past medical history of Factor V Leiden mutation; Anxiety; Hypothyroidism; ASCUS on Pap smear; Ovarian cyst; History of pulmonary embolism; Cervical cancer (dx 04-06-2013  [facility]/  [facility] DR Sondra Come FOR BRACHYTHERAPY (HIGH DOSE RADIATION VIA TANDEM RING)); and MRSA (methicillin resistant staph aureus) culture positive.    PAST SURGICAL HISTORY: Past Surgical History  Procedure Laterality Date  . Cervical biopsy  03/17/2013  . Cervical conization w/bx  2009    FAMILY HISTORY: family history includes Stroke in her mother.  SOCIAL HISTORY:  reports that she has never smoked. She has never used smokeless tobacco. She reports that she does not drink alcohol or use illicit drugs.  ALLERGIES: Review of patient's allergies indicates no known allergies.  MEDICATIONS:  No current facility-administered medications for this  encounter.   Current Outpatient Prescriptions  Medication Sig Dispense Refill  . Biotin 10 MG TABS Take 10 mg by mouth daily.       . cholecalciferol (VITAMIN D) 1000 UNITS tablet Take 1,000 Units by mouth daily.      . clindamycin (CLEOCIN) 300 MG capsule Take 300 mg by mouth 3 (three) times daily.      . clonazePAM (KLONOPIN) 2 MG tablet Take 2 mg by mouth 2 (two) times daily.       . cyanocobalamin 1000 MCG tablet Take 100 mcg by mouth daily.      Marland Kitchen HYDROcodone-acetaminophen (NORCO/VICODIN) 5-325 MG per tablet Take 1 tablet by mouth every 6 (six) hours as needed for moderate pain.      Marland Kitchen levothyroxine (SYNTHROID, LEVOTHROID) 112 MCG tablet Take 112 mcg by mouth daily before breakfast.      . PARoxetine (PAXIL) 30 MG tablet Take 60 mg by mouth daily.       . Sulfamethoxazole-Trimethoprim (BACTRIM PO) Take 1 tablet by mouth 2 (two) times daily.      . vitamin E 400 UNIT capsule Take 400 Units by mouth daily.        REVIEW OF SYSTEMS:  A 15 point review of systems is documented in the electronic medical record. This was obtained by the nursing staff. However, I reviewed this with the patient to discuss relevant findings and make appropriate changes. She is tolerating her external beam and radiosensitizing chemotherapy well. Her vaginal bleeding has stopped at this time.   PHYSICAL EXAM:  height is 5\' 5"  (1.651 m) and weight is 136 lb (61.689 kg).   General Appearance:  Alert, cooperative, no distress,  appears stated age   Head:  Normocephalic, without obvious abnormality, atraumatic   Eyes:  PERRL, conjunctiva/corneas clear, EOM's intact   Ears:  Normal TM's and external ear canals, both ears   Nose:  Nares normal, septum midline, mucosa normal, no drainage or sinus tenderness   Throat:  Lips, mucosa, and tongue normal; teeth and gums normal   Neck:  Supple, symmetrical, trachea midline, no adenopathy;  thyroid: no enlargement/tenderness/nodules; no carotid  bruit or JVD   Back:   Symmetric, no curvature, ROM normal, no CVA tenderness   Lungs:  Clear to auscultation bilaterally, respirations unlabored   Chest Wall:  No tenderness or deformity   Heart:  Regular rate and rhythm, S1 and S2 normal, no murmur, rub or gallop      Abdomen:  Soft, non-tender, bowel sounds active all four quadrants,  no masses, no organomegaly   Genitalia:  Normal external genitalia, on speculum examination there is no bleeding noted in the vaginal vault. the cervix area has biopsy changes with no active bleeding. on bimanual examination the cervix was expanded to approximately 6 cm and quite firm with palpation. The cervix protrudes approximately 1/3 the way down the vaginal vault. The vaginal fornices appear intact. It is difficult to determine parametrial involvement. No obvious sidewall involvement   Rectal:  Normal tone, confirms bimanual exam   Extremities:  Extremities normal, atraumatic, no cyanosis or edema   Pulses:  2+ and symmetric all extremities   Skin:  Skin color, texture, turgor normal, no rashes or lesions   Lymph nodes:  Cervical, supraclavicular, and axillary nodes normal   Neurologic:  CNII-XII intact, normal strength, sensation and reflexes  throughout       LABORATORY DATA: Current labs will be faxed from the [place] today   RADIOGRAPHY: Nm Pet Image Initial (pi) Skull Base To Thigh  04/14/2013   CLINICAL DATA:  Initial treatment strategy for cervical cancer.  EXAM: NUCLEAR MEDICINE PET SKULL BASE TO THIGH  FASTING BLOOD GLUCOSE:  Value: 101mg /dl  TECHNIQUE: 16.9 mCi F-18 FDG was injected intravenously. CT data was obtained and used for attenuation correction and anatomic localization only. (This was not acquired as a diagnostic CT examination.) Additional exam technical data entered on technologist worksheet.  COMPARISON:  CT scan 03/13/2013.  FINDINGS: NECK  No hypermetabolic lymph nodes in the neck.  CHEST  No hypermetabolic mediastinal or hilar nodes. No  suspicious pulmonary nodules on the CT scan.  ABDOMEN/PELVIS  The solid abdominal organs are unremarkable. No findings for metastatic disease. There is a large bulky cervical mass as demonstrated on the recent CT scan. This demonstrates marked FDG uptake with SUV max of 26.9. There is also an FDG positive (SUV max 6.4) 15 mm external iliac lymph node on the left and a smaller positive (3.1 SUV max) left perirectal lymph node. A small retroperitoneal lymph node located at the aortic bifurcation level is also weakly FDG positive (SUV max 3.2).  The right adnexal lesion does not show any FDG uptake and could be an exophytic fibroid or possibly a benign ovarian fibrothecoma. There are small cyst associated with both ovaries.  SKELETON  No focal hypermetabolic activity to suggest skeletal metastasis.  IMPRESSION: 1. Large cervical mass with neoplastic range FDG uptake (SUV max 26.9). 2. FDG positive pelvic and retroperitoneal lymph nodes. 3. The 4.5 cm right adnexal mass does not demonstrate abnormal FDG uptake and could be an exophytic fibroid or possibly an ovarian fibrothecoma.   Electronically Signed  By: Kalman Jewels M.D.   On: 04/14/2013 13:07      IMPRESSION: Advanced cervical cancer  PLAN: Patient will be taken to the operating room on January 15 for exam under anesthesia, placement of fiducial markers and a tandem ring system in preparation for high-dose rate radiation therapy. Patient will receive 5 weekly high-dose rate treatments. She will continue her external beam radiation therapy on weekdays she is not receiving brachytherapy treatments. ------------------------------------------------  -----------------------------------  Blair Promise, PhD, MD

## 2013-05-31 NOTE — Progress Notes (Signed)
Patient rating pain at a 0.  Assisted Dr. Sondra Come with tandem and ovoid removal.  Removed foley catheter intact.  Patient has 400 ml in collection bag of clear, yellow urine.  Flushed left upper PICC line with 10 cc normal saline and 250 units of heparin.  Covered with 2x2 and secured with medipore tape.  Helped patient get dressed.  She was taken to the waiting area in a wheelchair by Nicki Reaper, Nurse Transporter.

## 2013-05-31 NOTE — Progress Notes (Signed)
Gave 1 mg Dilaudid IV per Dr. Sondra Come for patient's cramping pain of a 7/10.  Will continue to monitor.

## 2013-05-31 NOTE — Transfer of Care (Signed)
Immediate Anesthesia Transfer of Care Note  Patient: Tammie Marsh  Procedure(s) Performed: Procedure(s) (LRB): TANDEM RING INSERTION (N/A)  Patient Location: PACU  Anesthesia Type: General  Level of Consciousness: awake, sedated, patient cooperative and responds to stimulation  Airway & Oxygen Therapy: Patient Spontanous Breathing and Patient connected to nasal cannula O2  Post-op Assessment: Report given to PACU RN, Post -op Vital signs reviewed and stable and Patient moving all extremities  Post vital signs: Reviewed and stable  Complications: No apparent anesthesia complications

## 2013-05-31 NOTE — Anesthesia Postprocedure Evaluation (Signed)
  Anesthesia Post-op Note  Patient: Tammie Marsh  Procedure(s) Performed: Procedure(s): TANDEM RING INSERTION (N/A)  Patient Location: PACU  Anesthesia Type:General  Level of Consciousness: awake and sedated  Airway and Oxygen Therapy: Patient Spontanous Breathing  Post-op Pain: moderate  Post-op Assessment: Post-op Vital signs reviewed  Post-op Vital Signs: stable  Complications: No apparent anesthesia complications

## 2013-05-31 NOTE — Progress Notes (Signed)
Received report from [place], [place] in Short Stay PACU.  Ms. Kurtenbach has LR to gravity through her left upper arm PICC line.  She has a foley catheter draining clear, yellow urine.  Escorted her to CT SIM with Nicki Reaper, Nurse Transporter.

## 2013-05-31 NOTE — Progress Notes (Signed)
Patient states pain is OK.  Call light in reach and will continue to monitor.

## 2013-05-31 NOTE — Discharge Instructions (Signed)

## 2013-05-31 NOTE — Op Note (Signed)
05/31/2013  9:42 AM  PATIENT:  Tammie Marsh  53 y.o. female  PRE-OPERATIVE DIAGNOSIS:  cervical cancer  POST-OPERATIVE DIAGNOSIS:  cervical cancer  PROCEDURE:  Procedure(s): TANDEM RING INSERTION (N/A)  SURGEON:  Surgeon(s) and Role:    * Blair Promise, MD - Primary  PHYSICIAN ASSISTANT:   ASSISTANTS: none   ANESTHESIA:   general, LMA  EBL:  Total I/O In: 800 [I.V.:800] Out: - ~ 200 cc, EBL 1-3 cc  BLOOD ADMINISTERED:none  DRAINS: Urinary Catheter (Foley)   LOCAL MEDICATIONS USED:  NONE  SPECIMEN:  No Specimen  DISPOSITION OF SPECIMEN:  N/A  COUNTS:  YES  TOURNIQUET:  * No tourniquets in log *  DICTATION:  The patient was taken to the operating room and placed in the dorsolithotomy position. Patient was prepped and draped in the usual sterile fashion. A Foley catheter was placed without difficulty. The patient then proceeded to undergo examination under anesthesia. Compared to exam prior to starting treatment her cervical mass had decreased in size. There appeared to be involvement of the left parametria with limited mobility along the left side. Cervix and parametrial movement on exam seems to be better compared to last week. The cervix was retracted slightly to the left. Estimated cervical size was approximately 4 cm. Patient then proceeded to undergo a uterine sounding and dilation of the cervix. This was performed with the assistance of intraoperative ultrasound. Prior to ultrasound the patient had approximately 200 cc of sterile water placed in the bladder. Good images were obtained however an accurate size estimate of the cervical mass could not be obtained. The uterus was moderately anteverted and sounded to approximately 6-7 cm. The cervix was dilated and then the patient had placement of a 45, a 60 mm tandem within the uterine cavity. Excellent placement was noted on intraoperative ultrasound. Patient then had a 30 mm ring/45 orientation placed at the cervix  area.This ring was attached to the tandem. She then had a 45 rectal paddle placed. Sterile packing soaked in Estrace cream was placed along the rectum . Marland Kitchen Patient tolerated the procedure well. She had minimal bleeding with the procedure. After she has recovered from anesthesia she will be transported to the radiation oncology department for planning and her fourth high-dose-rate treatment with iridium 192 as the high-dose-rate source.   PLAN OF CARE: transfer to radiation oncology for planning and treatment  PATIENT DISPOSITION:  PACU - hemodynamically stable.   Delay start of Pharmacological VTE agent (>24hrs) due to surgical blood loss or risk of bleeding: not applicable

## 2013-05-31 NOTE — Progress Notes (Signed)
Department of Radiation Oncology  Phone: [phone]  Fax: [phone]    High-dose-rate brachytherapy procedure note   Simple treatment device note  While in the operating room I constructed the patient's custom high-dose-rate treatment set up. Patient will be treated with a 60 mm tandem and a 30 mm ring, both with 45 angle orientation.  A 45 rectal paddle was also placed.  Verification simulation note   After planning was complete the patient was transferred to the high dose rate suite. Fiducial markers remained in place from her simulation. An AP and lateral film was obtained in the treatment position. This is compared to the patient's planning films earlier today documenting good position of the tandem/ring for treatment.   High-dose-rate brachytherapy procedure   The ring tandem system was attached to the high dose rate afterloading unit by catheter system. The patient proceeded to undergo her fourth high-dose-rate treatment directed at the cervical region. The patient was prescribed a dose of 5.5 gray. This was achieved with a total dwell time of 507.8 seconds. Patient was treated with two channels using 11 dwell positions in the tandem and 9 dwell positions within the ring system. Patient tolerated the procedure well. After completion of her therapy a radiation survey was performed documenting return of the iridium source into the Nucletron safe period.  -----------------------------------

## 2013-05-31 NOTE — Progress Notes (Signed)
Patient rating cramping/pain at 7/10.  Gave 0.5 mg of dilaudid IV per Dr. Sondra Come.

## 2013-05-31 NOTE — Anesthesia Preprocedure Evaluation (Addendum)
Tammie Evaluation  Patient identified by MRN, date of birth, ID band Patient awake  General Assessment Comment:Tammie Record         DOB: Nov 12, 1960       Final Tammie Type    Tammie Type:      Final Tammie Type: Tammie Marsh <[id number]>     Female     53 y.o. Current as of 05/11/13 0709    Height Weight BMI NPO Status    5\' 5"  (1.651 m)  (05/09/13) 135 lb (61.236 kg)  (05/11/13) 22.7  (05/09/13) 1700       Allergies ASA Status      No Known Allergies        III      Case Summary    Date: 05/11/13    Surgeon: Blair Promise, MD    Responsible Provider: Peyton Najjar, MD    Location: [facility] OR ROOM 4 / [place]    Procedure (Code)  EXAMINATION UNDER ANETHESIA, PLACEMENT OF GOLD MARKERS, PLACEMENT OF TANDEM RING FOR HIGH DOSE RADIATION THERAPY (N/A Vagina )      Diagnosis (Codes)  CERVICAL CANCER        Final Tammie Type    Tammie Type:      Final Tammie Type: General       Patient Diagnosis    Pre-op diagnosis: CERVICAL CANCER    Post-op diagnosis: CERVICAL CANCER      General Information    Date: 05/11/2013 Case classification: Scheduled Status: Posted    Location: [place] Room: [facility] OR ROOM 4 Service: Urology    Patient class: Ambulatory Surgery          Prescription Medications Within last 10 days from 05/11/13      Last Taken Last Updated    Biotin 10 MG TABS   05/10/2013 at Unknown time 05/11/13 0655    cholecalciferol (VITAMIN D) 1000 UNITS tablet   05/10/2013 at Unknown time 05/11/13 0655    clindamycin (CLEOCIN) 300 MG capsule   05/10/2013 at Unknown time 05/11/13 0655    clonazePAM (KLONOPIN) 2 MG tablet   05/11/2013 at 0530 05/11/13 0655    cyanocobalamin 1000 MCG tablet   05/10/2013 at Unknown time 05/11/13 0655    HYDROcodone-acetaminophen (NORCO/VICODIN) 5-325 MG per tablet   05/10/2013 at Unknown time 05/11/13 0655   levothyroxine (SYNTHROID, LEVOTHROID) 112 MCG tablet   05/11/2013 at 0530 05/11/13 0655    PARoxetine (PAXIL) 30 MG tablet   05/11/2013 at 0530 05/11/13 0655    Sulfamethoxazole-Trimethoprim (BACTRIM PO)   05/10/2013 at Unknown time 05/11/13 0655    vitamin E 400 UNIT capsule   05/10/2013 at Unknown time 05/11/13 0655    ibuprofen (ADVIL,MOTRIN) 800 MG tablet (Discontinued)   Taking 04/11/13 1434      Events    Date Time Event    05/11/13 0735 Tammie Start      Tammie Marsh Start Data Collection      [phone] Patient re-evaluated        Patient re-evaluated prior to induction.        Tammie Marsh Induction      0740 LMA Inserted      6948 LMA Removed      0840 Emergence      0841 Stop Data Collection      0845 Handoff        I completed my SBAR handoff to the receiving nurse in  the PACU.        [phone] Tammie Stop      Intraprocedure Grid/Graph           Staff Responsible times on 05/11/13    Name Role Tammie Marsh [place], CRNA CRNA [phone]      Lines, Drains, and Airways    Type Details Placement Removal    PICC / Midline Single Lumen Yes; PICC; Left; Taped 05/11/13 0720      Urethral Catheter 05/11/13; 0745; Verlon Setting, nancy; Latex; 16 Fr. 05/11/13 0745 by Shana Chute        Case Tracking Events    Event Time In    Tammie Start Thu May 11, 2013 [id number]    Procedure Start Time Thu May 11, 2013 [id number]    Procedure End Thu May 11, 2013 [id number]    Patient Al Decant May 11, 2013 [id number]    In Recovery Thu May 11, 2013 0845    Tammie Marsh May 11, 2013 [id number]    Out of Recovery Thu May 11, 2013 1004        Procedure Notes    Last edited 05/11/13 [id number] by Tammie Aid, CRNA    Procedure Name: LMA Insertion Date/Time: 05/11/2013 7:40 AM Performed by: Tammie Marsh Pre-Tammie Checklist: Patient identified, Emergency Drugs available, Suction available and Patient being monitored Patient  Re-evaluated:Patient Re-evaluated prior to inductionOxygen Delivery Method: Circle System Utilized Preoxygenation: Pre-oxygenation with 100% oxygen Intubation Type: IV induction Ventilation: Mask ventilation without difficulty LMA: LMA inserted LMA Size: 4.0 Number of attempts: 1 Airway Equipment and Method: bite block Placement Confirmation: positive ETCO2 Dental Injury: Teeth and Oropharynx as per pre-operative assessment               Electronically signed by Tammie Aid, CRNA at 05/11/2013  7:52 AM      Attestation Information    Staff Name Date Time Type    Tammie Marsh 05/11/13 [id number] Time Out    Tammie Marsh 05/11/13 [id number] Time Out    Peyton Najjar, MD 05/11/13 0739 Present at Induction    Peyton Najjar, MD 05/11/13 [phone] Present at Emergence    Tammie Marsh 05/11/13 0847 Intra-Op      Pre Signoff    Tammie Marsh marked on 05/11/13 at [place] by Peyton Najjar, MD.    05/11/13 at [phone] by Peyton Najjar, MD    05/11/13 at [phone] by Tammie Aid, CRNA    05/10/13 at 1621 by Peyton Najjar, MD        Last Written Preprocedure Note    Last edited 05/11/13 0714 by Peyton Najjar, MD                                      Tammie Evaluation  Patient identified by MRN, date of birth, ID band Patient awake     Reviewed: Allergy & Precautions, H&P , NPO status , Patient's Chart, lab work & pertinent test results    Airway Mallampati: II TM Distance: >3 FB Neck ROM: full   Dental (+) Caps and Dental Advisory Given Cap lower front:    Pulmonary neg pulmonary ROS,  History PE breath sounds clear to auscultation Pulmonary exam normal       Cardiovascular Exercise Tolerance:  Good negative cardio ROS   Rhythm:regular Rate:Normal     Neuro/Psych Anxiety negative neurological ROS negative psych ROS     GI/Hepatic negative GI ROS, Neg liver ROS,    Endo/Other   negative endocrine ROSHypothyroidism     Renal/GU negative Renal ROS  negative genitourinary    Musculoskeletal   Abdominal    Peds   Hematology (+) Blood dyscrasia, , Factor V Leiden mutation    Tammie Other Findings   Reproductive/Obstetrics negative OB ROS Cervical cancer                                             Tammie Physical Tammie Plan   ASA: III   Tammie Plan: General    Post-op Pain Management:    Induction: Intravenous   Airway Management Planned: LMA   Additional Equipment:    Intra-op Plan:    Post-operative Plan:    Informed Consent: I have reviewed the patients History and Physical, chart, labs and discussed the procedure including the risks, benefits and alternatives for the proposed Tammie with the patient or authorized representative who has indicated his/her understanding and acceptance.    Dental Advisory Given   Plan Discussed with: CRNA and Surgeon   Tammie Plan Comments:            Tammie Quick Evaluation            Electronically signed by Peyton Najjar, MD at 05/10/2013  4:21 PM Electronically signed by Peyton Najjar, MD at 05/11/2013  7:14 AM      Tammie History    History Date Comments History Date Comments    No specialty history recorded    Other Medical History    Factor V Leiden mutation   Anxiety   Hypothyroidism      ASCUS on Pap smear   Ovarian cyst   History of pulmonary embolism      Cervical cancer dx 04-06-2013  [facility]/  [facility] DR Sondra Come FOR BRACHYTHERAPY (HIGH DOSE RADIATION VIA TANDEM RING) MRSA (methicillin resistant staph aureus) culture positive            Tammie Family History    Problem Relations (Age of Onset)    Problems found, but filtered away        Substance History    Smoking Status: Never Smoker     Smokeless Tobacco Status: Never Used    Alcohol use: No    Drug use: No      Blood Orders  Ordered in last 14 days.    No blood orders found      Obstetric History as of 05/12/2013    The patient has not been asked about pregnancy.      Surgical History    CERVICAL BIOPSY CERVICAL CONIZATION W/BX      Problem List    Hx pulmonary embolism Hypothyroidism    Fibroids Anxiety    Cervical cancer        NPO Status    NPO: 1700 Verified: 05/11/13 0655 by Kem Boroughs, RN    Liquids: [id number] Verified: 05/11/13 0655 by Kem Boroughs, RN    Solids: 1700 Verified: 05/11/13 0655 by Kem Boroughs, RN         Last Menstrual Period - Last Recorded    LMP  04/14/2013                Medication Given    midazolam (VERSED) injection 1 mg/mL (mg) 2 mg Given 05/11/13 0735 Tammie Aid, CRNA      fentaNYL injection (mcg) 50 mcg Given 05/11/13 0757 Tammie Aid, CRNA        50 mcg Given   0828 Tammie Aid, CRNA      lidocaine (cardiac) injection 2 % (mg) 80 mg Given 05/11/13 0739 Tammie Aid, CRNA      propofol (DIPRIVAN) BOLUS only (mg) 150 mg Given 05/11/13 0739 Tammie Aid, CRNA      dexamethasone (DECADRON) injection 4 mg/mL (mg) 10 mg Given 05/11/13 2025 Tammie Aid, CRNA      ondansetron [facility]) injection 4 mg/2 ml (mg) 4 mg Given 05/11/13 0826 Tammie Aid, CRNA      ketorolac (TORADOL) injection 30 mg (mg) 30 mg Given 05/11/13 4270 Tammie Aid, CRNA      glycopyrrolate (ROBINUL) injection (mg) 0.2 mg Given 05/11/13 0759 Tammie Aid, CRNA      acetaminophen (mg) 1,000 mg (over 30 min) Given 05/11/13 0749 Tammie Aid, CRNA      lactated ringers infusion (mL) 100 mL Tammie Volume Adjustment 05/11/13 0735 Tammie Aid, CRNA      Dosing weight:  61.7 kg 50 mL Tammie Volume Adjustment   [place], CRNA                 Intraop Assessment      05/11/2013 0735 05/11/2013 0739 05/11/2013 0745 05/11/2013 0800    EKG: SB - - -    Head Assessment: - Face Checked Face  Checked Face Checked    Forced Air Warming Device: - - 43 -                   05/11/2013 0815 05/11/2013 0830        EKG: - -        Head Assessment: Face Checked Face Checked        Forced Air Warming Device: - -                       Post-Induction      05/11/2013 0739          Eye care: Tape Applied                   CV    No data found.      L and D    No data found.      Positioning      05/11/2013 0735 05/11/2013 0742 05/11/2013 0751      Positions: Supine Position;Right arm secured on padded support <90 degrees abduction;Left arm secured on padded suport <90 degrees abduction Lithotomy Position;Right arm secured on padded support <90 degrees abduction;Left arm secured on padded suport <90 degrees abduction;Patient Placed in Stirrups, Legs Padded Trendelenburg Position      Checklist: Head and Neck Neutral Head and Neck Neutral -                     Pre-Induction      05/11/2013 0704 05/11/2013 0735 05/11/2013 0743      Tammie Checklist:: Tammie Machine & Monitor/Equipment Checked, Alarms on & functioning;Suction Available, Checked & Functioning;Airway Equipment Checked and Available;Tammie and Resusciatation Drugs Available;Difficult Airway Cart & Equipment Available;Tammie Machine Number: - -  [id number]          Patient Self Position: - Patient Moved Self to OR Table -      Monitors Applied: - Tammie Monitors/Equipment Applied -      NIBP Site: - Arm Upper  R -      Cardiac: - EKG -      EKG Leads: - II -      Monitors: - Pulse Oximeter Applied -      Forced Air Warming Device: - - Upper Body                     Emergence      05/11/2013 0840 05/11/2013 0841        Emergence Events: Spontaneous Respirations -        Airway Events : LMA Removed;Maintaining Airway -        LOC : Patient Drowsy Patient Drowsy        Transfer: - Transported to PACU;Transported with Oxygen via Nasal Cannula                         All Post Notes     Last edited 05/11/13 0946 by Peyton Najjar, MD      Tammie Post-op Note   Patient: Tammie Marsh   Procedure(s) Performed: Procedure(s) (LRB): EXAMINATION UNDER ANETHESIA, PLACEMENT OF GOLD MARKERS, PLACEMENT OF TANDEM RING FOR HIGH DOSE RADIATION THERAPY (N/A)   Patient Location: PACU   Tammie Type: General   Level of Consciousness: awake and alert    Airway and Oxygen Therapy: Patient Spontanous Breathing   Post-op Pain: mild   Post-op Assessment: Post-op Vital signs reviewed, Patient's Cardiovascular Status Stable, Respiratory Function Stable, Patent Airway and No signs of Nausea or vomiting   Last Vitals:  Filed Vitals:     05/11/13 0930   BP:  125/66   Pulse:  74   Temp:     Resp:  9      Post-op Vital Signs: stable     Complications: No apparent Tammie complications      Follow-up notes    No postoperative notes have been written.      PACU Handoff Notes    Last edited 05/11/13 0846 by Tammie Aid, CRNA    Immediate Tammie Transfer of Care Note   Patient: Tammie Marsh   Procedure(s) Performed: Procedure(s): EXAMINATION UNDER ANETHESIA, PLACEMENT OF GOLD MARKERS, PLACEMENT OF TANDEM RING FOR HIGH DOSE RADIATION THERAPY (N/A)   Patient Location: PACU   Tammie Type:General   Level of Consciousness: sedated and responds to stimulation   Airway & Oxygen Therapy: Patient Spontanous Breathing and Patient connected to nasal cannula oxygen   Post-op Assessment: Report given to PACU RN   Post vital signs: Reviewed and stable   Complications: No apparent Tammie complications      Pre Vitals     BP: 111/60 Pulse: 59 Resp: 16  SpO2: 100 Temp: 36 C    Ht: 5\' 5"  (1.651 m)  (05/09/13) Wt: 135 lb (61.236 kg)  (05/11/13)  BMI: 22.7 IBW: 125 lb 10.6 oz (57 kg)  Last edited 05/11/13 [id number] by DC        Post Vitals     BP: 121/78 Pulse: 96 Resp: 10  SpO2: 100 Temp: 36.7 C    Ht: 5\' 5"  (1.651 m)  (05/09/13)  Wt: 135 lb (61.236 kg)  (05/11/13)  BMI: 22.7 IBW: 125 lb 10.6 oz (  57 kg)  Last edited 05/11/13 0845 by AA            Go to the Billing and Compliance Report for this case.      Addendum Summary    No postoperative notes have been written.        Reviewed: Allergy & Precautions, H&P , NPO status , Patient's Chart, lab work & pertinent test results  Airway Mallampati: II TM Distance: >3 FB Neck ROM: Full    Dental no notable dental hx. (+) Teeth Intact, Dental Advisory Given and Caps,    Pulmonary neg pulmonary ROS, PE breath sounds clear to auscultation  Pulmonary exam normal       Cardiovascular negative cardio ROS  Rhythm:Regular Rate:Normal     Neuro/Psych negative neurological ROS  negative psych ROS   GI/Hepatic negative GI ROS, Neg liver ROS,   Endo/Other  Hypothyroidism   Renal/GU negative Renal ROS  negative genitourinary   Musculoskeletal negative musculoskeletal ROS (+)   Abdominal   Peds negative pediatric ROS (+)  Hematology  (+) Blood dyscrasia, , Factor V Leiden mutation   Tammie Other Findings   Reproductive/Obstetrics negative OB ROS                           Tammie Physical Tammie Plan  ASA: III  Tammie Plan: General   Post-op Pain Management:    Induction: Intravenous  Airway Management Planned: LMA  Additional Equipment:   Intra-op Plan:   Post-operative Plan: Extubation in OR  Informed Consent: I have reviewed the patients History and Physical, chart, labs and discussed the procedure including the risks, benefits and alternatives for the proposed Tammie with the patient or authorized representative who has indicated his/her understanding and acceptance.   Dental advisory given  Plan Discussed with: CRNA  Tammie Plan Comments:         Tammie Quick Evaluation

## 2013-05-31 NOTE — Progress Notes (Signed)
Patient had called and left message about financial asst application., I called and gave her the phone# to call and check status. I called and left it on her voicemail also. She had surgery today.

## 2013-05-31 NOTE — Anesthesia Procedure Notes (Signed)
Procedure Name: LMA Insertion Date/Time: 05/31/2013 8:36 AM Performed by: Justice Rocher Pre-anesthesia Checklist: Patient identified, Emergency Drugs available, Suction available and Patient being monitored Patient Re-evaluated:Patient Re-evaluated prior to inductionOxygen Delivery Method: Circle System Utilized Preoxygenation: Pre-oxygenation with 100% oxygen Intubation Type: IV induction Ventilation: Mask ventilation without difficulty LMA: LMA inserted LMA Size: 4.0 Number of attempts: 1 Airway Equipment and Method: bite block Placement Confirmation: positive ETCO2 Tube secured with: Tape Dental Injury: Teeth and Oropharynx as per pre-operative assessment

## 2013-06-01 ENCOUNTER — Encounter (HOSPITAL_BASED_OUTPATIENT_CLINIC_OR_DEPARTMENT_OTHER): Payer: Self-pay | Admitting: Radiation Oncology

## 2013-06-05 ENCOUNTER — Encounter (HOSPITAL_BASED_OUTPATIENT_CLINIC_OR_DEPARTMENT_OTHER): Payer: Self-pay | Admitting: *Deleted

## 2013-06-05 ENCOUNTER — Other Ambulatory Visit: Payer: Self-pay | Admitting: Radiation Oncology

## 2013-06-05 DIAGNOSIS — C539 Malignant neoplasm of cervix uteri, unspecified: Secondary | ICD-10-CM

## 2013-06-05 NOTE — Progress Notes (Signed)
NPO AFTER MN. ARRIVE AT 0700. CURRENT LAB RESULTS TO BE FAXED FROM [facility]. WILL TAKE SYNTHROID, KLONOPIN, AND PAXIL AM DOS W/ SIPS OF WATER.

## 2013-06-06 NOTE — Anesthesia Preprocedure Evaluation (Addendum)
Anesthesia Evaluation  Patient identified by MRN, date of birth, ID band Patient awake    Reviewed: Allergy & Precautions, H&P , NPO status , Patient's Chart, lab work & pertinent test results  Airway Mallampati: II TM Distance: >3 FB Neck ROM: full    Dental no notable dental hx. (+) Teeth Intact, Dental Advisory Given   Pulmonary  PE history breath sounds clear to auscultation  Pulmonary exam normal       Cardiovascular Exercise Tolerance: Good negative cardio ROS  Rhythm:regular Rate:Normal     Neuro/Psych Anxiety negative neurological ROS  negative psych ROS   GI/Hepatic negative GI ROS, Neg liver ROS,   Endo/Other  negative endocrine ROSHypothyroidism   Renal/GU negative Renal ROS  negative genitourinary   Musculoskeletal   Abdominal   Peds  Hematology negative hematology ROS (+) anemia , Leiden factor V mutation   Anesthesia Other Findings   Reproductive/Obstetrics Cervical cancer                          Anesthesia Physical Anesthesia Plan  ASA: III  Anesthesia Plan: General   Post-op Pain Management:    Induction: Intravenous  Airway Management Planned: LMA  Additional Equipment:   Intra-op Plan:   Post-operative Plan:   Informed Consent: I have reviewed the patients History and Physical, chart, labs and discussed the procedure including the risks, benefits and alternatives for the proposed anesthesia with the patient or authorized representative who has indicated his/her understanding and acceptance.   Dental Advisory Given  Plan Discussed with: CRNA and Surgeon  Anesthesia Plan Comments:         Anesthesia Quick Evaluation

## 2013-06-07 ENCOUNTER — Ambulatory Visit (HOSPITAL_BASED_OUTPATIENT_CLINIC_OR_DEPARTMENT_OTHER): Payer: Managed Care, Other (non HMO) | Admitting: Anesthesiology

## 2013-06-07 ENCOUNTER — Encounter (HOSPITAL_BASED_OUTPATIENT_CLINIC_OR_DEPARTMENT_OTHER): Payer: Self-pay

## 2013-06-07 ENCOUNTER — Ambulatory Visit (HOSPITAL_BASED_OUTPATIENT_CLINIC_OR_DEPARTMENT_OTHER)
Admission: RE | Admit: 2013-06-07 | Discharge: 2013-06-07 | Disposition: A | Discharge status: Home / Self Care | Payer: Managed Care, Other (non HMO) | Source: Ambulatory Visit | Attending: Radiation Oncology | Admitting: Radiation Oncology

## 2013-06-07 ENCOUNTER — Encounter (HOSPITAL_BASED_OUTPATIENT_CLINIC_OR_DEPARTMENT_OTHER)
Admission: RE | Disposition: A | Discharge status: Home / Self Care | Payer: Self-pay | Source: Ambulatory Visit | Attending: Radiation Oncology

## 2013-06-07 ENCOUNTER — Encounter (HOSPITAL_BASED_OUTPATIENT_CLINIC_OR_DEPARTMENT_OTHER): Payer: Managed Care, Other (non HMO) | Admitting: Anesthesiology

## 2013-06-07 ENCOUNTER — Ambulatory Visit
Admission: RE | Admit: 2013-06-07 | Discharge: 2013-06-07 | Disposition: A | Discharge status: Home / Self Care | Payer: Managed Care, Other (non HMO) | Source: Ambulatory Visit | Attending: Radiation Oncology | Admitting: Radiation Oncology

## 2013-06-07 ENCOUNTER — Ambulatory Visit (HOSPITAL_COMMUNITY): Payer: Managed Care, Other (non HMO)

## 2013-06-07 ENCOUNTER — Telehealth: Payer: Self-pay | Admitting: Oncology

## 2013-06-07 ENCOUNTER — Ambulatory Visit (HOSPITAL_COMMUNITY)
Admission: RE | Admit: 2013-06-07 | Discharge: 2013-06-07 | Disposition: A | Discharge status: Home / Self Care | Payer: Managed Care, Other (non HMO) | Source: Ambulatory Visit | Attending: Radiation Oncology | Admitting: Radiation Oncology

## 2013-06-07 VITALS — BP 127/73 | HR 74 | Temp 97.7°F

## 2013-06-07 DIAGNOSIS — E039 Hypothyroidism, unspecified: Secondary | ICD-10-CM | POA: Insufficient documentation

## 2013-06-07 DIAGNOSIS — Z79899 Other long term (current) drug therapy: Secondary | ICD-10-CM | POA: Insufficient documentation

## 2013-06-07 DIAGNOSIS — C539 Malignant neoplasm of cervix uteri, unspecified: Secondary | ICD-10-CM

## 2013-06-07 DIAGNOSIS — Z923 Personal history of irradiation: Secondary | ICD-10-CM | POA: Insufficient documentation

## 2013-06-07 DIAGNOSIS — F411 Generalized anxiety disorder: Secondary | ICD-10-CM | POA: Insufficient documentation

## 2013-06-07 DIAGNOSIS — D649 Anemia, unspecified: Secondary | ICD-10-CM | POA: Insufficient documentation

## 2013-06-07 DIAGNOSIS — Z86711 Personal history of pulmonary embolism: Secondary | ICD-10-CM | POA: Insufficient documentation

## 2013-06-07 HISTORY — DX: Anemia, unspecified: D64.9

## 2013-06-07 HISTORY — PX: TANDEM RING INSERTION: SHX6199

## 2013-06-07 SURGERY — INSERTION, UTERINE TANDEM AND RING OR CYLINDER, FOR BRACHYTHERAPY
Anesthesia: General | Site: Uterus

## 2013-06-07 MED ORDER — MIDAZOLAM HCL 2 MG/2ML IJ SOLN
INTRAMUSCULAR | Status: AC
  Filled 2013-06-07: qty 2

## 2013-06-07 MED ORDER — PROPOFOL 10 MG/ML IV BOLUS
INTRAVENOUS | Status: DC | PRN
  Administered 2013-06-07: 200 mg via INTRAVENOUS

## 2013-06-07 MED ORDER — LACTATED RINGERS IV SOLN
INTRAVENOUS | Status: DC
  Filled 2013-06-07: qty 1000

## 2013-06-07 MED ORDER — FENTANYL CITRATE 0.05 MG/ML IJ SOLN
INTRAMUSCULAR | Status: DC | PRN
  Administered 2013-06-07 (×2): 50 ug via INTRAVENOUS

## 2013-06-07 MED ORDER — SODIUM CHLORIDE 0.9 % IJ SOLN
10.0000 mL | Freq: Once | INTRAMUSCULAR | Status: AC
  Administered 2013-06-07: 10 mL via INTRAVENOUS

## 2013-06-07 MED ORDER — FENTANYL CITRATE 0.05 MG/ML IJ SOLN
INTRAMUSCULAR | Status: AC
  Filled 2013-06-07: qty 2

## 2013-06-07 MED ORDER — HYDROCORTISONE ACETATE 25 MG RE SUPP: 25.0000 mg | Freq: Two times a day (BID) | RECTAL | Status: DC

## 2013-06-07 MED ORDER — METOCLOPRAMIDE HCL 5 MG/ML IJ SOLN
INTRAMUSCULAR | Status: DC | PRN
  Administered 2013-06-07: 10 mg via INTRAVENOUS

## 2013-06-07 MED ORDER — HYDROMORPHONE HCL PF 1 MG/ML IJ SOLN
0.5000 mg | INTRAMUSCULAR | Status: DC | PRN
  Administered 2013-06-07: 0.5 mg via INTRAVENOUS
  Filled 2013-06-07: qty 1

## 2013-06-07 MED ORDER — LIDOCAINE HCL (CARDIAC) 20 MG/ML IV SOLN
INTRAVENOUS | Status: DC | PRN
  Administered 2013-06-07: 80 mg via INTRAVENOUS

## 2013-06-07 MED ORDER — HYDROMORPHONE HCL PF 1 MG/ML IJ SOLN
INTRAMUSCULAR | Status: AC
  Filled 2013-06-07: qty 1

## 2013-06-07 MED ORDER — WATER FOR IRRIGATION, STERILE IR SOLN
Status: DC | PRN
  Administered 2013-06-07: 3000 mL

## 2013-06-07 MED ORDER — STERILE WATER FOR IRRIGATION IR SOLN
Status: DC | PRN
  Administered 2013-06-07: 1000 mL

## 2013-06-07 MED ORDER — MIDAZOLAM HCL 5 MG/5ML IJ SOLN
INTRAMUSCULAR | Status: DC | PRN
  Administered 2013-06-07: 2 mg via INTRAVENOUS

## 2013-06-07 MED ORDER — HYDROMORPHONE HCL PF 1 MG/ML IJ SOLN
0.5000 mg | Freq: Once | INTRAMUSCULAR | Status: AC
  Administered 2013-06-07: 1 mg via INTRAVENOUS
  Filled 2013-06-07: qty 0.5

## 2013-06-07 MED ORDER — ACETAMINOPHEN 10 MG/ML IV SOLN
INTRAVENOUS | Status: DC | PRN
  Administered 2013-06-07: 1000 mg via INTRAVENOUS

## 2013-06-07 MED ORDER — FENTANYL CITRATE 0.05 MG/ML IJ SOLN
25.0000 ug | INTRAMUSCULAR | Status: DC | PRN
  Administered 2013-06-07: 50 ug via INTRAVENOUS
  Filled 2013-06-07: qty 1

## 2013-06-07 MED ORDER — LACTATED RINGERS IV SOLN
INTRAVENOUS | Status: DC
  Administered 2013-06-07 (×2): via INTRAVENOUS
  Filled 2013-06-07: qty 1000

## 2013-06-07 MED ORDER — ONDANSETRON HCL 4 MG/2ML IJ SOLN
INTRAMUSCULAR | Status: DC | PRN
  Administered 2013-06-07: 4 mg via INTRAVENOUS

## 2013-06-07 MED ORDER — ESTRADIOL 0.1 MG/GM VA CREA
TOPICAL_CREAM | VAGINAL | Status: DC | PRN
  Administered 2013-06-07: 2 via VAGINAL

## 2013-06-07 MED ORDER — HEPARIN SOD (PORK) LOCK FLUSH 100 UNIT/ML IV SOLN
250.0000 [IU] | Freq: Once | INTRAVENOUS | Status: AC
  Administered 2013-06-07: 250 [IU] via INTRAVENOUS

## 2013-06-07 MED ORDER — KETOROLAC TROMETHAMINE 30 MG/ML IJ SOLN
INTRAMUSCULAR | Status: DC | PRN
  Administered 2013-06-07: 30 mg via INTRAVENOUS

## 2013-06-07 MED ORDER — DEXAMETHASONE SODIUM PHOSPHATE 4 MG/ML IJ SOLN
INTRAMUSCULAR | Status: DC | PRN
  Administered 2013-06-07: 10 mg via INTRAVENOUS

## 2013-06-07 MED ORDER — ACETAMINOPHEN 325 MG PO TABS
650.0000 mg | ORAL_TABLET | Freq: Once | ORAL | Status: AC
  Administered 2013-06-07: 650 mg via ORAL
  Filled 2013-06-07 (×2): qty 2

## 2013-06-07 MED ORDER — HYDROMORPHONE HCL PF 1 MG/ML IJ SOLN
INTRAMUSCULAR | Status: DC | PRN
  Administered 2013-06-07: .25 mg via INTRAVENOUS
  Administered 2013-06-07: 0.5 mg via INTRAVENOUS
  Administered 2013-06-07: .25 mg via INTRAVENOUS

## 2013-06-07 MED ORDER — FENTANYL CITRATE 0.05 MG/ML IJ SOLN
INTRAMUSCULAR | Status: AC
  Filled 2013-06-07: qty 6

## 2013-06-07 SURGICAL SUPPLY — 37 items
BAG URINE DRAINAGE (UROLOGICAL SUPPLIES) ×3 IMPLANT
BNDG CONFORM 2 STRL LF (GAUZE/BANDAGES/DRESSINGS) IMPLANT
CATH FOLEY 2WAY SLVR  5CC 16FR (CATHETERS) ×2
CATH FOLEY 2WAY SLVR 5CC 16FR (CATHETERS) ×1 IMPLANT
CLOTH BEACON ORANGE TIMEOUT ST (SAFETY) ×3 IMPLANT
COVER TABLE BACK 60X90 (DRAPES) ×3 IMPLANT
DRAPE LG THREE QUARTER DISP (DRAPES) ×3 IMPLANT
DRAPE UNDERBUTTOCKS STRL (DRAPE) ×3 IMPLANT
GLOVE BIO SURGEON STRL SZ7.5 (GLOVE) ×9 IMPLANT
GLOVE BIOGEL PI IND STRL 7.0 (GLOVE) ×1 IMPLANT
GLOVE BIOGEL PI IND STRL 7.5 (GLOVE) ×1 IMPLANT
GLOVE BIOGEL PI INDICATOR 7.0 (GLOVE) ×2
GLOVE BIOGEL PI INDICATOR 7.5 (GLOVE) ×2
GOWN PREVENTION PLUS LG XLONG (DISPOSABLE) IMPLANT
GOWN STRL REUS W/TWL LRG LVL3 (GOWN DISPOSABLE) ×3 IMPLANT
GOWN STRL REUS W/TWL XL LVL3 (GOWN DISPOSABLE) ×3 IMPLANT
HOLDER FOLEY CATH W/STRAP (MISCELLANEOUS) ×3 IMPLANT
LEGGING LITHOTOMY PAIR STRL (DRAPES) ×3 IMPLANT
NEEDLE SPNL 22GX3.5 QUINCKE BK (NEEDLE) IMPLANT
PACK BASIN DAY SURGERY FS (CUSTOM PROCEDURE TRAY) ×3 IMPLANT
PACKING VAGINAL (PACKING) ×3 IMPLANT
PAD ABD 8X10 STRL (GAUZE/BANDAGES/DRESSINGS) ×3 IMPLANT
PAD OB MATERNITY 4.3X12.25 (PERSONAL CARE ITEMS) IMPLANT
PAD PREP 24X48 CUFFED NSTRL (MISCELLANEOUS) ×3 IMPLANT
PLUG CATH AND CAP STER (CATHETERS) IMPLANT
SET IRRIG Y TYPE TUR BLADDER L (SET/KITS/TRAYS/PACK) ×3 IMPLANT
SLEEVE SURGEON STRL (DRAPES) ×3 IMPLANT
SUT PROLENE 0 SH 30 (SUTURE) IMPLANT
SUT SILK 2 0 30  PSL (SUTURE)
SUT SILK 2 0 30 PSL (SUTURE) IMPLANT
SYR BULB IRRIGATION 50ML (SYRINGE) IMPLANT
SYR CONTROL 10ML LL (SYRINGE) IMPLANT
SYRINGE 10CC LL (SYRINGE) IMPLANT
TOWEL OR 17X24 6PK STRL BLUE (TOWEL DISPOSABLE) ×6 IMPLANT
TRAY DSU PREP LF (CUSTOM PROCEDURE TRAY) ×3 IMPLANT
WATER STERILE IRR 3000ML UROMA (IV SOLUTION) ×3 IMPLANT
WATER STERILE IRR 500ML POUR (IV SOLUTION) ×3 IMPLANT

## 2013-06-07 NOTE — Progress Notes (Signed)
Patient has a headache that she is rating at a 10/10.  2 , 350 mg tylenol tablets given per Dr. Sondra Come.

## 2013-06-07 NOTE — OR Nursing (Signed)
O.R.CHL was down, documentation for this patient was in witting during the case and after documented in EPIC when the system came back.

## 2013-06-07 NOTE — Progress Notes (Signed)
POST-ANESTHESIA  IMMEDIATELY FOLLOWING SURGERY: Do not drive or operate machinery for the first twenty four hours after surgery. Do not make any important decisions for twenty four hours after surgery or while taking narcotic pain medications or sedatives. If you develop intractable nausea and vomiting or a severe headache please notify your doctor immediately.   FOLLOW-UP: You do not need to follow up with anesthesia unless specifically instructed to do so.   WOUND CARE INSTRUCTIONS (if applicable): Expect some mild vaginal bleeding, but if large amount of bleeding occurs please contact Dr. Sondra Come at [phone] or the Radiation On-Call physician. Call for any fever greater than 101.0 degrees or increasing vaginal//abdominal pain or trouble urinating.   QUESTIONS?: Please feel free to call your physician or the hospital operator if you have any questions, and they will be happy to assist you. Resume all medications: as listed on your after visit summary.  Your 1 month follow up is July 06, 2013 at 10:30.

## 2013-06-07 NOTE — Progress Notes (Signed)
Department of Radiation Oncology  Phone: [phone]  Fax: [phone]    High-dose-rate brachytherapy procedure note   Simple treatment device note   While in the operating room I constructed the patient's custom high-dose-rate treatment set up. Patient will be treated with a 60 mm tandem and a  26 mm ring, both with 45 angle orientation. A 45 rectal paddle was also placed.   Verification simulation note   After planning was complete the patient was transferred to the high dose rate suite. Fiducial markers remained in place from her simulation. An AP and lateral film was obtained in the treatment position. This is compared to the patient's planning films earlier today documenting good position of the tandem/ring for treatment.   High-dose-rate brachytherapy procedure   The ring tandem system was attached to the high dose rate afterloading unit by catheter system. The patient proceeded to undergo her fifth high-dose-rate treatment directed at the cervical region. The patient was prescribed a dose of 5.5 gray. This was achieved with a total dwell time of 547.8 seconds. Patient was treated with two channels using 11 dwell positions in the tandem and 8 dwell positions within the ring system. Patient tolerated the procedure well. After completion of her therapy a radiation survey was performed documenting return of the iridium source into the Nucletron safe period.  The ring tandem system was subsequently removed without difficulty. -----------------------------------

## 2013-06-07 NOTE — Anesthesia Procedure Notes (Signed)
Procedure Name: LMA Insertion Date/Time: 06/07/2013 8:34 AM Performed by: Mechele Claude Pre-anesthesia Checklist: Patient identified, Emergency Drugs available, Suction available and Patient being monitored Patient Re-evaluated:Patient Re-evaluated prior to inductionOxygen Delivery Method: Circle System Utilized Preoxygenation: Pre-oxygenation with 100% oxygen Intubation Type: IV induction Ventilation: Mask ventilation without difficulty LMA: LMA inserted LMA Size: 4.0 Number of attempts: 1 Airway Equipment and Method: bite block Placement Confirmation: positive ETCO2 Tube secured with: Tape Dental Injury: Teeth and Oropharynx as per pre-operative assessment

## 2013-06-07 NOTE — Progress Notes (Signed)
Received reports from [place], [place] in PACU.  Patient has LR hanging to gravity infusing through her left upper arm.  She has a foley catheter draining clear, yellow urine.  Escorted her to CT SIM.

## 2013-06-07 NOTE — Interval H&P Note (Signed)
History and Physical Interval Note:  06/07/2013 8:31 AM  Tammie Marsh  has presented today for surgery, with the diagnosis of CERVICAL CANCER  The various methods of treatment have been discussed with the patient and family. After consideration of risks, benefits and other options for treatment, the patient has consented to  Procedure(s): TANDEM RING INSERTION (N/A) as a surgical intervention .  The patient's history has been reviewed, patient examined, no change in status, stable for surgery.  I have reviewed the patient's chart and labs.  Questions were answered to the patient's satisfaction.     Gery Pray D

## 2013-06-07 NOTE — Progress Notes (Signed)
    Department of Radiation Oncology  Phone: [phone]  Fax: [phone]    Simulation note   Patient was brought down from the outpatient surgical center and placed on the CT scan her table. A rectal tube was placed with contrast instilled into the rectal vault. The patient's bladder catheter was accessed and contrast was placed in the bladder. Fiducial markers were placed within the tandem/ring system. Patient proceeded to undergo CT scan through the pelvis area. The tandem was in good position within the endometrial cavity. The ring was closely approximated to the cervix. Patient will proceed with planning for her Fifth high-dose-rate treatment. She will receive 5.5 gray.  -----------------------------------   Blair Promise, PhD, MD

## 2013-06-07 NOTE — H&P (View-Only) (Signed)
Radiation Oncology         [phone] ________________________________  Pre-operative note  Name: Tammie Marsh MRN: [id number]  Date: 05/05/2013  DOB: May 27, 1960  IP:[name], [name], DO  No ref. 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 female who is recently was diagnosed with advanced squamous cell carcinoma cervix.  On PET scan she was found to have lymphadenopathy within the pelvis and retroperitoneal area. Patient is currently receiving external beam radiation therapy at the [facility] in [place]. The patient has completed 32.4 gray. She is scheduled for her first brachytherapy procedure on January 15. She will undergo exam under anesthesia and placement of fiducial markers as well as a tandem/ring system for high-dose rate radiation therapy.    PREVIOUS RADIATION THERAPY: No  PAST MEDICAL HISTORY:  has a past medical history of Factor V Leiden mutation; Anxiety; Hypothyroidism; ASCUS on Pap smear; Ovarian cyst; History of pulmonary embolism; Cervical cancer (dx 04-06-2013  [facility]/  [facility] DR Sondra Come FOR BRACHYTHERAPY (HIGH DOSE RADIATION VIA TANDEM RING)); and MRSA (methicillin resistant staph aureus) culture positive.    PAST SURGICAL HISTORY: Past Surgical History  Procedure Laterality Date  . Cervical biopsy  03/17/2013  . Cervical conization w/bx  2009    FAMILY HISTORY: family history includes Stroke in her mother.  SOCIAL HISTORY:  reports that she has never smoked. She has never used smokeless tobacco. She reports that she does not drink alcohol or use illicit drugs.  ALLERGIES: Review of patient's allergies indicates no known allergies.  MEDICATIONS:  No current facility-administered medications for this  encounter.   Current Outpatient Prescriptions  Medication Sig Dispense Refill  . Biotin 10 MG TABS Take 10 mg by mouth daily.       . cholecalciferol (VITAMIN D) 1000 UNITS tablet Take 1,000 Units by mouth daily.      . clindamycin (CLEOCIN) 300 MG capsule Take 300 mg by mouth 3 (three) times daily.      . clonazePAM (KLONOPIN) 2 MG tablet Take 2 mg by mouth 2 (two) times daily.       . cyanocobalamin 1000 MCG tablet Take 100 mcg by mouth daily.      Marland Kitchen HYDROcodone-acetaminophen (NORCO/VICODIN) 5-325 MG per tablet Take 1 tablet by mouth every 6 (six) hours as needed for moderate pain.      Marland Kitchen levothyroxine (SYNTHROID, LEVOTHROID) 112 MCG tablet Take 112 mcg by mouth daily before breakfast.      . PARoxetine (PAXIL) 30 MG tablet Take 60 mg by mouth daily.       . Sulfamethoxazole-Trimethoprim (BACTRIM PO) Take 1 tablet by mouth 2 (two) times daily.      . vitamin E 400 UNIT capsule Take 400 Units by mouth daily.        REVIEW OF SYSTEMS:  A 15 point review of systems is documented in the electronic medical record. This was obtained by the nursing staff. However, I reviewed this with the patient to discuss relevant findings and make appropriate changes. She is tolerating her external beam and radiosensitizing chemotherapy well. Her vaginal bleeding has stopped at this time.   PHYSICAL EXAM:  height is 5\' 5"  (1.651 m) and weight is 136 lb (61.689 kg).   General Appearance:  Alert, cooperative, no distress,  appears stated age   Head:  Normocephalic, without obvious abnormality, atraumatic   Eyes:  PERRL, conjunctiva/corneas clear, EOM's intact   Ears:  Normal TM's and external ear canals, both ears   Nose:  Nares normal, septum midline, mucosa normal, no drainage or sinus tenderness   Throat:  Lips, mucosa, and tongue normal; teeth and gums normal   Neck:  Supple, symmetrical, trachea midline, no adenopathy;  thyroid: no enlargement/tenderness/nodules; no carotid  bruit or JVD   Back:   Symmetric, no curvature, ROM normal, no CVA tenderness   Lungs:  Clear to auscultation bilaterally, respirations unlabored   Chest Wall:  No tenderness or deformity   Heart:  Regular rate and rhythm, S1 and S2 normal, no murmur, rub or gallop      Abdomen:  Soft, non-tender, bowel sounds active all four quadrants,  no masses, no organomegaly   Genitalia:  Normal external genitalia, on speculum examination there is no bleeding noted in the vaginal vault. the cervix area has biopsy changes with no active bleeding. on bimanual examination the cervix was expanded to approximately 6 cm and quite firm with palpation. The cervix protrudes approximately 1/3 the way down the vaginal vault. The vaginal fornices appear intact. It is difficult to determine parametrial involvement. No obvious sidewall involvement   Rectal:  Normal tone, confirms bimanual exam   Extremities:  Extremities normal, atraumatic, no cyanosis or edema   Pulses:  2+ and symmetric all extremities   Skin:  Skin color, texture, turgor normal, no rashes or lesions   Lymph nodes:  Cervical, supraclavicular, and axillary nodes normal   Neurologic:  CNII-XII intact, normal strength, sensation and reflexes  throughout       LABORATORY DATA: Current labs will be faxed from the [facility] today   RADIOGRAPHY: Nm Pet Image Initial (pi) Skull Base To Thigh  04/14/2013   CLINICAL DATA:  Initial treatment strategy for cervical cancer.  EXAM: NUCLEAR MEDICINE PET SKULL BASE TO THIGH  FASTING BLOOD GLUCOSE:  Value: 101mg /dl  TECHNIQUE: 16.9 mCi F-18 FDG was injected intravenously. CT data was obtained and used for attenuation correction and anatomic localization only. (This was not acquired as a diagnostic CT examination.) Additional exam technical data entered on technologist worksheet.  COMPARISON:  CT scan 03/13/2013.  FINDINGS: NECK  No hypermetabolic lymph nodes in the neck.  CHEST  No hypermetabolic mediastinal or hilar nodes. No  suspicious pulmonary nodules on the CT scan.  ABDOMEN/PELVIS  The solid abdominal organs are unremarkable. No findings for metastatic disease. There is a large bulky cervical mass as demonstrated on the recent CT scan. This demonstrates marked FDG uptake with SUV max of 26.9. There is also an FDG positive (SUV max 6.4) 15 mm external iliac lymph node on the left and a smaller positive (3.1 SUV max) left perirectal lymph node. A small retroperitoneal lymph node located at the aortic bifurcation level is also weakly FDG positive (SUV max 3.2).  The right adnexal lesion does not show any FDG uptake and could be an exophytic fibroid or possibly a benign ovarian fibrothecoma. There are small cyst associated with both ovaries.  SKELETON  No focal hypermetabolic activity to suggest skeletal metastasis.  IMPRESSION: 1. Large cervical mass with neoplastic range FDG uptake (SUV max 26.9). 2. FDG positive pelvic and retroperitoneal lymph nodes. 3. The 4.5 cm right adnexal mass does not demonstrate abnormal FDG uptake and could be an exophytic fibroid or possibly an ovarian fibrothecoma.   Electronically Signed  By: Kalman Jewels M.D.   On: 04/14/2013 13:07      IMPRESSION: Advanced cervical cancer  PLAN: Patient will be taken to the operating room on January 15 for exam under anesthesia, placement of fiducial markers and a tandem ring system in preparation for high-dose rate radiation therapy. Patient will receive 5 weekly high-dose rate treatments. She will continue her external beam radiation therapy on weekdays she is not receiving brachytherapy treatments. ------------------------------------------------  -----------------------------------  Blair Promise, PhD, MD

## 2013-06-07 NOTE — Progress Notes (Signed)
Patient states that pain is 0/10.

## 2013-06-07 NOTE — Transfer of Care (Signed)
Immediate Anesthesia Transfer of Care Note  Patient: Tammie Marsh  Procedure(s) Performed: Procedure(s) (LRB): TANDEM RING INSERTION (N/A)  Patient Location: PACU  Anesthesia Type: General  Level of Consciousness: awake, alert  and oriented  Airway & Oxygen Therapy: Patient Spontanous Breathing and Patient connected to face mask oxygen  Post-op Assessment: Report given to PACU RN and Post -op Vital signs reviewed and stable  Post vital signs: Reviewed and stable  Complications: No apparent anesthesia complications

## 2013-06-07 NOTE — Telephone Encounter (Signed)
Called in prescription for hydrocortisone (ANUSOL-HC) 25 MG suppository 12 suppository 0 06/07/2013 Place 1 suppository (25 mg total) rectally 2 (two) times daily. - Rectal. Disp 12 suppositories, 0 refills, to [facility] in [place].

## 2013-06-07 NOTE — Progress Notes (Signed)
DC'd IV fluid.  Flushed left arm picc line with 10 cc normal saline and 250 units heparin.  Secured with 2x2 and medipore tape.  Removed foley catheter intact.  450 cc in collection bag.  Patient tolerated well.

## 2013-06-07 NOTE — Progress Notes (Signed)
Tammie Marsh is back from CT SIM.  Connected her SCD's.  Changed her IV tubing to pump tubing.  LR is infusing at 125 ml/hr.  Call light in reach.  Will continue to monitor.

## 2013-06-07 NOTE — Anesthesia Postprocedure Evaluation (Signed)
  Anesthesia Post-op Note  Patient: Tammie Marsh  Procedure(s) Performed: Procedure(s) (LRB): TANDEM RING INSERTION (N/A)  Patient Location: PACU  Anesthesia Type: General  Level of Consciousness: awake and alert   Airway and Oxygen Therapy: Patient Spontanous Breathing  Post-op Pain: mild  Post-op Assessment: Post-op Vital signs reviewed, Patient's Cardiovascular Status Stable, Respiratory Function Stable, Patent Airway and No signs of Nausea or vomiting  Last Vitals:  Filed Vitals:   06/07/13 1045  BP: 132/75  Pulse: 79  Temp:   Resp: 11    Post-op Vital Signs: stable   Complications: No apparent anesthesia complications

## 2013-06-07 NOTE — Op Note (Signed)
06/07/2013  9:50 AM  PATIENT:  Tammie Marsh  53 y.o. female  PRE-OPERATIVE DIAGNOSIS:  CERVICAL CANCER  POST-OPERATIVE DIAGNOSIS:  * No post-op diagnosis entered *  PROCEDURE:  Procedure(s): TANDEM RING INSERTION (N/A)  SURGEON:  Surgeon(s) and Role:    * Blair Promise, MD - Primary  PHYSICIAN ASSISTANT:   ASSISTANTS: none   ANESTHESIA:   general , LMA  EBL:  Total I/O In: 900 [I.V.:900] Out: - 150 cc, EBL 0 cc  BLOOD ADMINISTERED:none  DRAINS: Urinary Catheter (Foley)   LOCAL MEDICATIONS USED:  NONE  SPECIMEN:  No Specimen  DISPOSITION OF SPECIMEN:  N/A  COUNTS:  YES  TOURNIQUET:  * No tourniquets in log *  DICTATION:  The patient was taken to the operating room and placed in the dorsolithotomy position. Patient was prepped and draped in the usual sterile fashion. A Foley catheter was placed without difficulty. The patient then proceeded to undergo examination under anesthesia. Compared to exam prior to starting treatment her cervical mass had decreased in size. There appeared to be involvement of the left parametria with limited mobility along the left side. Cervix and parametrial movement on exam seems to be better compared to last week. The cervix was retracted slightly to the left. Estimated cervical size was approximately 3.5 cm. Patient then proceeded to undergo a uterine sounding and dilation of the cervix. This was performed with the assistance of intraoperative ultrasound. Prior to ultrasound the patient had approximately 200 cc of sterile water placed in the bladder. Good images were obtained however an accurate size estimate of the cervical mass could not be obtained. The uterus was moderately anteverted and sounded to approximately 6-7 cm. The cervix was dilated and then the patient had placement of a 45, a 60 mm tandem within the uterine cavity. Excellent placement was noted on intraoperative ultrasound. Patient then had a 26 mm ring/45 orientation placed at  the cervix area.This ring was attached to the tandem. She then had a 45 rectal paddle placed. Sterile packing soaked in Estrace cream was placed along the rectum . Marland Kitchen Patient tolerated the procedure well. She had no bleeding with the procedure. After she has recovered from anesthesia she will be transported to the radiation oncology department for planning and her fifth high-dose-rate treatment with iridium 192 as the high-dose-rate source.     PLAN OF CARE: Transfer to radiation oncology for planning and treatment  PATIENT DISPOSITION:  PACU - hemodynamically stable.   Delay start of Pharmacological VTE agent (>24hrs) due to surgical blood loss or risk of bleeding: not applicable

## 2013-06-08 ENCOUNTER — Encounter (HOSPITAL_BASED_OUTPATIENT_CLINIC_OR_DEPARTMENT_OTHER): Payer: Self-pay | Admitting: Radiation Oncology

## 2013-06-18 ENCOUNTER — Encounter: Payer: Self-pay | Admitting: Radiation Oncology

## 2013-06-18 NOTE — Progress Notes (Signed)
  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  A separate end of treatment summary will be dictated at this facility. Patient received intracavitary brachytherapy treatments alone in [place].  January 15, January 21, January 26, February 4, February 11  Site/dose:   Cervix  27.5 gray in 5 high-dose-rate treatments  Beams/energy:   Iridium 192 was used as the high-dose-rate source, a tandem/ring system was used to deliver the treatment  Narrative: The patient tolerated radiation treatment relatively well.   Her cervical mass regressed during the course of treatment although continued thickening was noted in the left parametrial area  Plan: The patient has completed radiation treatment. The patient will return to radiation oncology clinic for routine followup in one month. I advised them to call or return sooner if they have any questions or concerns related to their recovery or treatment.  -----------------------------------  Blair Promise, PhD, MD

## 2013-07-06 ENCOUNTER — Ambulatory Visit: Payer: Managed Care, Other (non HMO) | Admitting: Radiation Oncology

## 2013-07-21 ENCOUNTER — Encounter: Payer: Self-pay | Admitting: Oncology

## 2013-07-27 ENCOUNTER — Encounter: Payer: Self-pay | Admitting: Radiation Oncology

## 2013-07-27 ENCOUNTER — Telehealth: Payer: Self-pay | Admitting: *Deleted

## 2013-07-27 ENCOUNTER — Ambulatory Visit
Admission: RE | Admit: 2013-07-27 | Discharge: 2013-07-27 | Disposition: A | Discharge status: Home / Self Care | Payer: Managed Care, Other (non HMO) | Source: Ambulatory Visit | Attending: Radiation Oncology | Admitting: Radiation Oncology

## 2013-07-27 VITALS — BP 130/54 | HR 78 | Temp 97.8°F | Ht 65.0 in | Wt 137.4 lb

## 2013-07-27 DIAGNOSIS — C539 Malignant neoplasm of cervix uteri, unspecified: Secondary | ICD-10-CM

## 2013-07-27 NOTE — Telephone Encounter (Signed)
Tammie Marsh, Tammie Marsh [phone] called  appt for patient is for May 7,2015 at 1:45pm,( GYN )

## 2013-07-27 NOTE — Progress Notes (Signed)
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  She is doing quite well at this time. She is back to working full-time and  tolerating this well. She is able to eat any food that she would desire without any cramping nausea or diarrhea. She denies any pelvic or abdominal pain. She continues to have mild vaginal discharge without any bleeding. She requires only one pad per day.  No signs of hematuria or rectal bleeding.                          ALLERGIES:  is allergic to morphine and related.  Meds: Current Outpatient Prescriptions  Medication Sig Dispense Refill  . Biotin 10 MG TABS Take 10 mg by mouth daily.       . cholecalciferol (VITAMIN D) 1000 UNITS tablet Take 1,000 Units by mouth daily.      . clonazePAM (KLONOPIN) 2 MG tablet Take 2 mg by mouth 2 (two) times daily.       . cyanocobalamin 1000 MCG tablet Take 100 mcg by mouth daily.      Marland Kitchen levothyroxine (SYNTHROID, LEVOTHROID) 112 MCG tablet Take 112 mcg by mouth daily before breakfast.      . PARoxetine (PAXIL) 30 MG tablet Take 60 mg by mouth daily.       . vitamin E 400 UNIT capsule Take 400 Units by mouth daily.      Marland Kitchen bismuth subsalicylate (PEPTO BISMOL) 262 MG/15ML suspension Take 30 mLs by mouth every 6 (six) hours as needed for diarrhea or loose stools.      . diphenoxylate-atropine (LOMOTIL) 2.5-0.025 MG per tablet       . HYDROcodone-acetaminophen (NORCO/VICODIN) 5-325 MG per tablet Take 1 tablet by mouth every 6 (six) hours as needed for moderate pain.      . hydrocortisone (ANUSOL-HC) 25 MG suppository Place 1  suppository (25 mg total) rectally 2 (two) times daily.  12 suppository  0  . ondansetron (ZOFRAN) 8 MG tablet Take 8 mg by mouth every 8 (eight) hours as needed.        No current facility-administered medications for this encounter.    Physical Findings: The patient is in no acute distress. Patient is alert and oriented.  height is 5\' 5"  (1.651 m) and weight is 137 lb 6.4 oz (62.324 kg). Her temperature is 97.8 F (36.6 C). Her blood pressure is 130/54 and her pulse is 78. Her oxygen saturation is 100%. .  No palpable supraclavicular or axillary adenopathy. The lungs clear to auscultation. The heart has a regular rhythm and rate. The abdomen is soft and nontender with normal bowel sounds. Pelvic exam is not performed in light of her recent treatment completion.  Lab Findings: facility  Radiographic Findings: No results found.  Impression:  She is doing well at this time.  Plan:  Followup next week with Dr. Bobby Marsh for evaluation and blood work. I recommend the patient be seen by Dr. Skeet Marsh in mid May which will be approximately 3 months out from her treatment completion.  Today the patient was given a vaginal dilator and instructions on its use in light of her pelvic radiation therapy.  In light of the patient's initial stage additional chemotherapy may be recommended as part of her management.  ____________________________________ Blair Promise, MD

## 2013-07-27 NOTE — Telephone Encounter (Signed)
Called patient to inform of gyn appt. For 08-31-13 @ 1:45 pm, spoke with patient and she told me that she couldn't do this day, called gyn to get another day, lvm for  A return call

## 2013-07-27 NOTE — Progress Notes (Addendum)
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  She denies pain, nausea, diarrhea and fatigue.  She states she is back at work.  She reports a small amount of vaginal discharge.  She denies vaginal and rectal bleeding.  She reports that she received 3 more radiation treatments and one round of chemotherapy in [place].

## 2013-07-27 NOTE — Telephone Encounter (Signed)
Called patient back to give new appt. Date and time , patient changed his mind and decided to keep 08-31-13 @ 1:45 pm appt. With Dr. [name].

## 2013-08-31 ENCOUNTER — Ambulatory Visit: Payer: Managed Care, Other (non HMO) | Admitting: Gynecologic Oncology

## 2013-09-15 ENCOUNTER — Telehealth: Payer: Self-pay | Admitting: *Deleted

## 2013-09-15 NOTE — Telephone Encounter (Signed)
Pt called states " I need  A PET scan prior to my visit with Dr. [name] in June." Pt is very concerned states " the [place] and CT scan will not show if there is something going on". Will review with MD.

## 2013-09-19 ENCOUNTER — Other Ambulatory Visit: Payer: Self-pay | Admitting: *Deleted

## 2013-09-20 ENCOUNTER — Other Ambulatory Visit: Payer: Self-pay | Admitting: *Deleted

## 2013-09-20 ENCOUNTER — Telehealth: Payer: Self-pay | Admitting: *Deleted

## 2013-09-20 DIAGNOSIS — C539 Malignant neoplasm of cervix uteri, unspecified: Secondary | ICD-10-CM

## 2013-09-20 NOTE — Telephone Encounter (Signed)
Notified pt of PET scan scheduled on October 10, 2013 @ 11:00. PT was instructed to arrive at 10:45, and  also not to consume any food or sugar 6hrs prior to her appointment. Pt was told she could drink water. Pt agreed with appointment time and date and voiced understanding of appointment instructions.

## 2013-09-20 NOTE — Telephone Encounter (Signed)
PET ordered, pt aware.

## 2013-10-10 ENCOUNTER — Ambulatory Visit (HOSPITAL_COMMUNITY): Payer: Managed Care, Other (non HMO)

## 2013-10-10 ENCOUNTER — Ambulatory Visit: Payer: Managed Care, Other (non HMO) | Admitting: Gynecologic Oncology

## 2013-10-19 ENCOUNTER — Ambulatory Visit: Payer: Managed Care, Other (non HMO) | Admitting: Gynecologic Oncology

## 2013-11-14 ENCOUNTER — Telehealth: Payer: Self-pay | Admitting: Gynecologic Oncology

## 2013-11-14 ENCOUNTER — Encounter (HOSPITAL_COMMUNITY): Admission: RE | Admit: 2013-11-14 | Payer: Managed Care, Other (non HMO) | Source: Ambulatory Visit

## 2013-11-14 NOTE — Telephone Encounter (Addendum)
Office Visit:  GYN ONCOLOGY    SHIZA THELEN 53 y.o. female  CC:  Cervical cancer   Assessment/Plan:  Ms. SHELI DORIN  is a 53 y.o.  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  (Pelvic LN  mets noted on PET imaging) Definitive CDDP chemoradiaiton was completed in Feb 2015. She has received 4 ctycles adjuvant taxol/carboplaitn completed.   HPI: Ms. NAJWA SPILLANE  is a 53 y.o. last normal menstrual period approximately 2012.. She reports over 2014 she had severe abdominal cramping with intermittent episodes of heavy bleeding. She presented to emergency room on 03/13/2013 with heavy vaginal bleeding. A CT scan of the abdomen and pelvis was notable for a mass expanding the cervix and lower uterine segment measuring 7.9 x 4.7 cm x 3 cm a solid right adnexal mass measuring 5.5 cm in size was also noted this was thought to reflect a pedunculated fibroid. There is a single enhancing left external iliac lymph node measuring 14 mm in the short axis. Patient was taken to the operating room on 03/17/2013 at which time there was a biopsy performed of the cervical mass and an aborted hysteroscopy D&C. Pathology was consistent with squamous mucosa with high-grade squamous dysplasia suspicious for invasive squamous cell carcinoma.  Ms. Drum history is notable for a long history of abnormal Pap tests and previous treatments with cryotherapy and cold knife cone biopsy. The last cervical excision was in 2009. She states that her last abnormal Pap test was in 2012 and wnl.  A biopsy was collected in 03/17/2013 at [facility] indicated at least severe dysplasia squamous cell carcinoma in situ. . On the presenting  physical examination there was a 7.5 cm cervix with extension to bilateral parametrial. The cervix appears barrel shaped and several additional biopsies were collected. That confirmed  invasive squamous cell cancer.   The CT scan demonstrates possible  left iliac lymph node involvement. A PET confirmed this suspicion.  The patient received her external beam radiation therapy at the [facility] in [place] received intracavitary brachytherapy treatments alone in [place]. January 15, January 21, January 26, February 4, June 07, 2013 Site/dose: Cervix 27.5 gray in 5 high-dose-rate treatments   She received 4 cycles taxol/carbo completedXXXX   Social Hx:   History   Social History  . Marital Status: Single    Spouse Name: N/A    Number of Children: N/A  . Years of Education: N/A   Occupational History  . Not on file.   Social History Main Topics  . Smoking status: Never Smoker   . Smokeless tobacco: Never Used  . Alcohol Use: No  . Drug Use: No  . Sexual Activity: No   Other Topics Concern  . Not on file   Social History Narrative  . No narrative on file    Past Surgical Hx:  Past Surgical History  Procedure Laterality Date  . Cervical biopsy  03/17/2013  . Cervical conization w/bx  2009  . Tandem ring insertion N/A 05/11/2013    Procedure: EXAMINATION UNDER ANETHESIA, PLACEMENT OF GOLD MARKERS, PLACEMENT OF TANDEM RING FOR HIGH DOSE RADIATION THERAPY;  Surgeon: Blair Promise, MD;  Location: [facility];  Service: Urology;  Laterality: N/A;  . Tandem ring insertion N/A 05/17/2013    Procedure: TANDEM RING PLACEMENT;  Surgeon: Blair Promise, MD;  Location: [facility];  Service: Urology;  Laterality:  N/A;  . Tandem ring insertion N/A 05/22/2013    Procedure: TANDEM RING PLACEMENT;  Surgeon: Blair Promise, MD;  Location: [facility];  Service: Urology;  Laterality: N/A;  . Tandem ring insertion N/A 05/31/2013    Procedure: TANDEM RING INSERTION;  Surgeon: Blair Promise, MD;  Location: [facility];  Service: Urology;  Laterality: N/A;  . Tandem ring insertion N/A 06/07/2013    Procedure: TANDEM RING INSERTION;  Surgeon: Blair Promise,  MD;  Location: [facility];  Service: Urology;  Laterality: N/A;    Past Medical Hx:  Past Medical History  Diagnosis Date  . Factor V Leiden mutation   . Anxiety   . Hypothyroidism   . ASCUS on Pap smear   . Ovarian cyst   . History of pulmonary embolism   . Cervical cancer dx 04-06-2013  [facility]/  [facility] DR Sondra Come FOR BRACHYTHERAPY (HIGH DOSE RADIATION VIA TANDEM RING)    STAGE IIB   T1b2, N1,  M0  invasive differentiated squamous cell carcinoma, FIGO  . MRSA (methicillin resistant staph aureus) culture positive     LEFT BUTTOCK AREA --  USING NEOSPORIN AND TAKING  ORAL ANTIBIOTICS  . S/P PICC central line placement   . Fatigue     SECONDARY TO CHEMORADIATION  . Complication of anesthesia     pt has better pain control w dilaudid than morphine  . Anemia   . History of radiation therapy 05/11/13, 05/17/13, 05/22/13, 05/31/13, 06/07/13    27.5 gray to cervix    Past Gynecological History: Gravida 0 menarche at 51 with regular menses.  Long standing history of abnormal Pap test with a report of several episodes of cryotherapy. Reports cold knife cone biopsy in 2009. Last abnormal Pap test per the patient was 2 years ago. No LMP recorded.  Family Hx:  Family History  Problem Relation Age of Onset  . Stroke Mother     Review of Systems: Constitutional  Feels well Cardiovascular  No chest pain, shortness of breath, or edema  Pulmonary  No cough or wheeze.  Gastro Intestinal  No nausea, vomitting, or diarrhoea. No bright red blood per rectum, no abdominal pain, change in bowel movement, or constipation.  Genito Urinary  No frequency, urgency, dysuria, daily vaginal bleeding. Musculo Skeletal  No myalgia, arthralgia, joint swelling or pain  Neurologic  No weakness, numbness, change in gait,  Psychology  Long standing depression   Vitals:  Blood pressure 111/62, pulse 92, temperature 98.5 F (36.9 C),  temperature source Oral, resp. rate 16, height 5' 4.17" (1.63 m), weight 139 lb 3.2 oz (63.141 kg). Physical Exam: WD in NAD Neck  Supple NROM, without any enlargements.  Lymph Node Survey No cervical supraclavicular or inguinal adenopathy Cardiovascular  Pulse normal rate, regularity and rhythm. S1 and S2 normal.  Lungs  Clear to auscultation bilateraly, Good air movement.  Psychiatry  Alert and oriented to person, place, and time, flat affect, slow response, appropriate judgement, slow speech.  Abdomen  Normoactive bowel sounds, abdomen soft, non-tender. No CVA tenderness Genito Urinary  Vulva/vagina: Normal external female genitalia.  No lesions. No discharge or bleeding.  Bladder/urethra:  No lesions or masses  Vagina: Estrogenized, no vaginal mets.  Cervix:   Uterus: 8cm mobile,no  bilateral parametrial involvement or nodularity.  Adnexa: No palpable masses. Rectal  Good tone, no masses bilateral parametrial involvement Extremities  No bilateral cyanosis, clubbing or edema.

## 2013-11-16 ENCOUNTER — Other Ambulatory Visit (HOSPITAL_COMMUNITY)
Admission: RE | Admit: 2013-11-16 | Discharge: 2013-11-16 | Disposition: A | Discharge status: Home / Self Care | Payer: Managed Care, Other (non HMO) | Source: Ambulatory Visit | Attending: Gynecologic Oncology | Admitting: Gynecologic Oncology

## 2013-11-16 ENCOUNTER — Ambulatory Visit: Payer: Managed Care, Other (non HMO) | Attending: Gynecologic Oncology | Admitting: Gynecologic Oncology

## 2013-11-16 VITALS — BP 132/81 | HR 91 | Temp 98.5°F | Resp 16 | Ht 65.0 in

## 2013-11-16 DIAGNOSIS — N959 Unspecified menopausal and perimenopausal disorder: Secondary | ICD-10-CM

## 2013-11-16 DIAGNOSIS — Z923 Personal history of irradiation: Secondary | ICD-10-CM

## 2013-11-16 DIAGNOSIS — C539 Malignant neoplasm of cervix uteri, unspecified: Secondary | ICD-10-CM | POA: Insufficient documentation

## 2013-11-16 DIAGNOSIS — D6859 Other primary thrombophilia: Secondary | ICD-10-CM | POA: Insufficient documentation

## 2013-11-16 DIAGNOSIS — Z9221 Personal history of antineoplastic chemotherapy: Secondary | ICD-10-CM

## 2013-11-16 DIAGNOSIS — I890 Lymphedema, not elsewhere classified: Secondary | ICD-10-CM

## 2013-11-16 DIAGNOSIS — Z01419 Encounter for gynecological examination (general) (routine) without abnormal findings: Secondary | ICD-10-CM | POA: Insufficient documentation

## 2013-11-16 DIAGNOSIS — Z8541 Personal history of malignant neoplasm of cervix uteri: Secondary | ICD-10-CM

## 2013-11-16 DIAGNOSIS — E039 Hypothyroidism, unspecified: Secondary | ICD-10-CM | POA: Insufficient documentation

## 2013-11-16 NOTE — Progress Notes (Signed)
Office Visit:  GYN ONCOLOGY    Tammie Marsh 53 y.o. female  CC:  Cervical cancer   Assessment/Plan:  [name]  is a 53 y.o.  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  (Pelvic LN  mets noted on PET imaging) Definitive CDDP chemoradiaiton was completed in Feb 2015. She has received 4 ctycles adjuvant taxol/carboplaitn completed.10/03/2013 Tammie Marsh believed that XRT removed her uterus, cervix ovaries and tubes.  She was counseled that the organs remain in place and that surgery is not indicated/  PET CT 11/14/2013 without evidence of metastatic disease. Pap collected today Annual Pap and PET  Lymphedema: Advised to use the [facility] stockings are recommended. Elevate legs when seated and sleeping if possible  Vasomotor symptoms. Factor V Leiden deficiency Tammie Marsh will discuss with PCP change of meds from paxil to effexor for better control of hot flashes.    HPI: [name]  is a 53 y.o. last normal menstrual period approximately 2012.. She reports over 2014 she had severe abdominal cramping with intermittent episodes of heavy bleeding. She presented to emergency room on 03/13/2013 with heavy vaginal bleeding. A CT scan of the abdomen and pelvis was notable for a mass expanding the cervix and lower uterine segment measuring 7.9 x 4.7 cm x 3 cm a solid right adnexal mass measuring 5.5 cm in size was also noted this was thought to reflect a pedunculated fibroid. There is a single enhancing left external iliac lymph node measuring 14 mm in the short axis. Patient was taken to the operating room on 03/17/2013 at which time there was a biopsy performed of the cervical mass and an aborted hysteroscopy D&C. Pathology was consistent with squamous mucosa with high-grade squamous dysplasia suspicious for invasive squamous cell carcinoma.  Tammie Marsh history is notable for a long history of abnormal Pap tests and previous treatments  with cryotherapy and cold knife cone biopsy. The last cervical excision was in 2009. She states that her last abnormal Pap test was in 2012 and wnl.  A biopsy was collected in 03/17/2013 at [facility] indicated at least severe dysplasia squamous cell carcinoma in situ. . On the presenting  physical examination there was a 7.5 cm cervix with extension to bilateral parametrial. The cervix appears barrel shaped and several additional biopsies were collected that confirmed  invasive squamous cell cancer.   The CT scan demonstrates possible left iliac lymph node involvement. A PET confirmed this suspicion.   The patient received her external beam radiation therapy at the [facility] in [place].Tammie Marsh received intracavitary brachytherapy treatments alone in [place]. January 15, January 21, January 26, February 4, June 07, 2013   She received 4 cycles taxol/carbo completed 10/03/2013  PET 11/14/2013 without evidence of hypermetabolic changes.  Benign right  adnexal process appreciated 3.8x4.7cm  Social Hx:   History   Social History  . Marital Status: Single    Spouse Name: N/A    Number of Children: N/A  . Years of Education: N/A   Occupational History  . Not on file.   Social History Main Topics  . Smoking status: Never Smoker   . Smokeless tobacco: Never Used  . Alcohol Use: No  . Drug Use: No  . Sexual Activity: No   Other Topics Concern  . Not on file   Social History Narrative  . No narrative on file  Past Surgical Hx:  Past Surgical History  Procedure Laterality Date  . Cervical biopsy  03/17/2013  . Cervical conization w/bx  2009  . Tandem ring insertion N/A 05/11/2013    Procedure: EXAMINATION UNDER ANETHESIA, PLACEMENT OF GOLD MARKERS, PLACEMENT OF TANDEM RING FOR HIGH DOSE RADIATION THERAPY;  Surgeon: Tammie Promise, MD;  Location: [facility];  Service: Urology;  Laterality: N/A;  . Tandem ring insertion N/A 05/17/2013     Procedure: TANDEM RING PLACEMENT;  Surgeon: Tammie Promise, MD;  Location: [facility];  Service: Urology;  Laterality: N/A;  . Tandem ring insertion N/A 05/22/2013    Procedure: TANDEM RING PLACEMENT;  Surgeon: Tammie Promise, MD;  Location: [facility];  Service: Urology;  Laterality: N/A;  . Tandem ring insertion N/A 05/31/2013    Procedure: TANDEM RING INSERTION;  Surgeon: Tammie Promise, MD;  Location: [facility];  Service: Urology;  Laterality: N/A;  . Tandem ring insertion N/A 06/07/2013    Procedure: TANDEM RING INSERTION;  Surgeon: Tammie Promise, MD;  Location: [facility];  Service: Urology;  Laterality: N/A;    Past Medical Hx:  Past Medical History  Diagnosis Date  . Factor V Leiden mutation   . Anxiety   . Hypothyroidism   . ASCUS on Pap smear   . Ovarian cyst   . History of pulmonary embolism   . Cervical cancer dx 04-06-2013  [facility]/  [facility] DR Tammie Marsh FOR BRACHYTHERAPY (HIGH DOSE RADIATION VIA TANDEM RING)    STAGE IIB   T1b2, N1,  M0  invasive differentiated squamous cell carcinoma, FIGO  . MRSA (methicillin resistant staph aureus) culture positive     LEFT BUTTOCK AREA --  USING NEOSPORIN AND TAKING  ORAL ANTIBIOTICS  . S/P PICC central line placement   . Fatigue     SECONDARY TO CHEMORADIATION  . Complication of anesthesia     pt has better pain control w dilaudid than morphine  . Anemia   . History of radiation therapy 05/11/13, 05/17/13, 05/22/13, 05/31/13, 06/07/13    27.5 gray to cervix    Past Gynecological History: Gravida 0 menarche at 79 with regular menses.  Long standing history of abnormal Pap test with a report of several episodes of cryotherapy. Reports cold knife cone biopsy in 2009. Last abnormal Pap test per the patient was 2 years ago. No LMP recorded.  Family Hx:  Family History  Problem Relation Age of Onset  . Stroke Mother     Review of  Systems: Constitutional  Feels well, alopecia and loss of eyebrows Cardiovascular  No chest pain, shortness of breath, reports lower ext edema when standing Pulmonary  No cough or wheeze.  Gastro Intestinal  No nausea, vomitting, or diarrhoea. No bright red blood per rectum, no abdominal pain, change in bowel movement, or constipation.  Genito Urinary  No frequency, urgency, dysuria, no vaginal bleeding. Musculo Skeletal  No myalgia, arthralgia, joint swelling or pain  Neurologic  No weakness,report mild  Numbness of both soles, no change in gait,  Psychology  Long standing depression   Vitals:  Blood pressure 132/81, pulse 91, temperature 98.5 F (36.9 C), temperature source Oral, resp. rate 16, height 5\' 5"  (1.651 m). Physical Exam: WD in NAD Neck  Supple NROM, without any enlargements.  Lymph Node Survey No cervical supraclavicular or inguinal adenopathy Cardiovascular  Pulse normal rate, regularity and rhythm. Lungs  Clear to auscultation bilateraly, Good air movement.  Psychiatry  Alert and oriented to person, flat affect, slow response, appropriate judgement, slow speech, slow reasoning, reasoning impaired Abdomen  Normoactive bowel sounds, abdomen soft, non-tender. No CVA tenderness Genito Urinary  Vulva/vagina: Normal external female genitalia.  No lesions. No discharge or bleeding.  Bladder/urethra:  No lesions or masses   Uterus mobile, cervix without lesions, deviated posteriorly, fibrotic changes involving the  Parametria bilaterally.  Adnexa: No palpable masses. Rectal  Good tone, no masses fibrotic changes involving the parametria Extremities  2+ BLE edema

## 2013-11-16 NOTE — Patient Instructions (Addendum)
No evidence of malignancy Pap collected.  Will contact you with the results. Discuss EFFEXOR with PCP  Follow up with Dr. Bobby Rumpf in 4 months Follow-up with Gyn Oncology in 6 months    Thank you very much Tammie Marsh for allowing me to provide care for you today.  I appreciate your confidence in choosing our Gynecologic Oncology team.  If you have any questions about your visit today please call our office and we will get back to you as soon as possible.  Please consider using the website Medlineplus.gov as an Geneticist, molecular.   Francetta Found. Greenlee Ancheta MD., PhD Gynecologic Oncology

## 2013-11-21 LAB — CYTOLOGY - PAP

## 2013-12-13 ENCOUNTER — Telehealth: Payer: Self-pay | Admitting: *Deleted

## 2013-12-13 NOTE — Telephone Encounter (Signed)
Message copied by Zaylah Blecha, Aletha Halim on Wed Dec 13, 2013  1:16 PM ------      Message from: Joylene John D      Created: Tue Dec 12, 2013  8:14 AM       Please let her know pap smear was normal with no signs of cancer.  Just showed some normal radiation changes.  Thank you.            ----- Message -----         From: [facility] Interface         Sent: 11/22/2013   1:15 PM           To: Dorothyann Gibbs, NP                   ------

## 2013-12-13 NOTE — Telephone Encounter (Signed)
Attempt to reach pt unsucessful. LMOVM for pt pap smear was normal, no signs of cancer. Some normal radiation changes. Requested pt call clinic to confirm message was received.

## 2014-05-11 IMAGING — PT NM PET TUM IMG INITIAL (PI) SKULL BASE T - THIGH
5 series · 25 of 25 positions shown · non-contrast
Comparison: CT scan 03/13/2013.

CLINICAL DATA: Initial treatment strategy for cervical cancer.

EXAM:
NUCLEAR MEDICINE PET SKULL BASE TO THIGH
FASTING BLOOD GLUCOSE:  Value: 101mg/dl
TECHNIQUE: 16.9 mCi F-18 FDG was injected intravenously. CT data was obtained
and used for attenuation correction and anatomic localization only.
(This was not acquired as a diagnostic CT examination.) Additional
exam technical data entered on technologist worksheet.

[Series 2: ct slices · axial · 3.8mm · 0.98mm/px · z∈[-847,+22]mm · 7 of 267 slices shown]
[im 1/267]
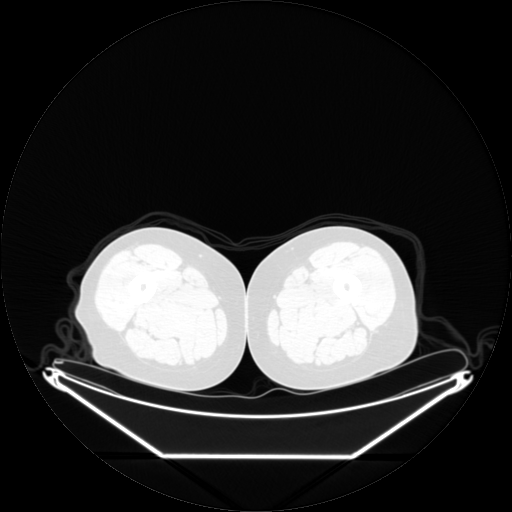
[im 45/267]
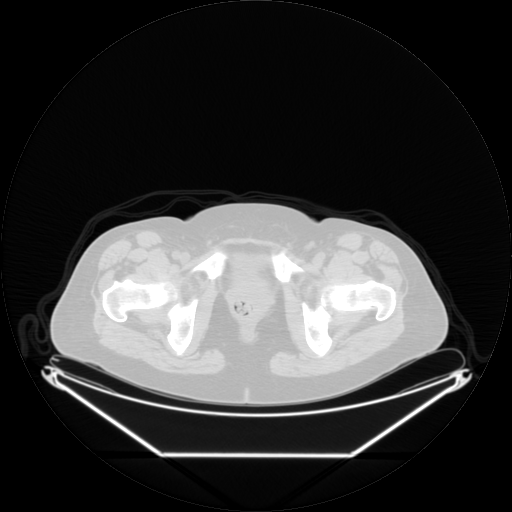
[im 89/267]
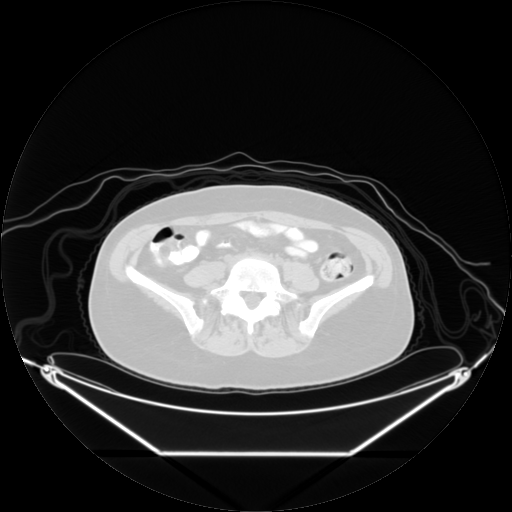
[im 134/267]
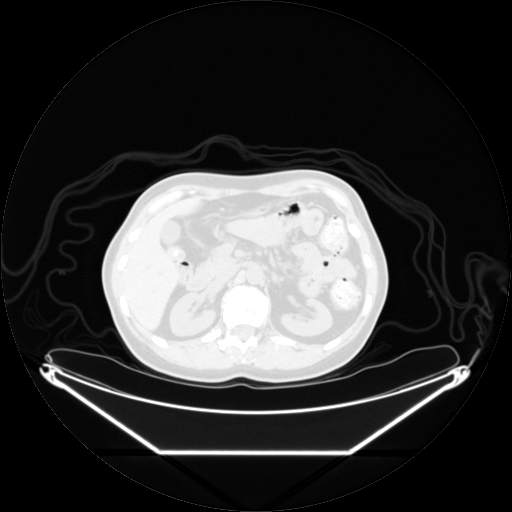
[im 178/267]
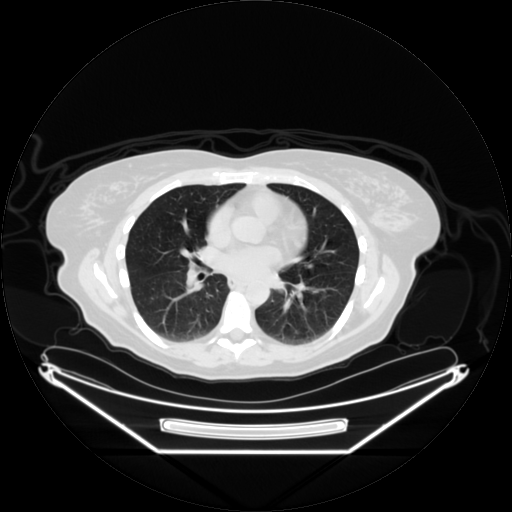
[im 222/267]
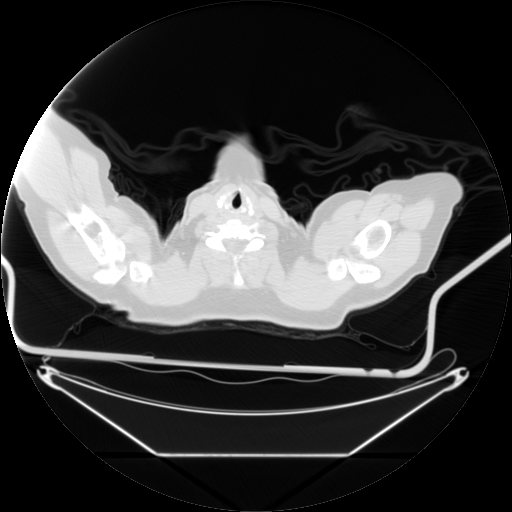
[im 267/267  brain]
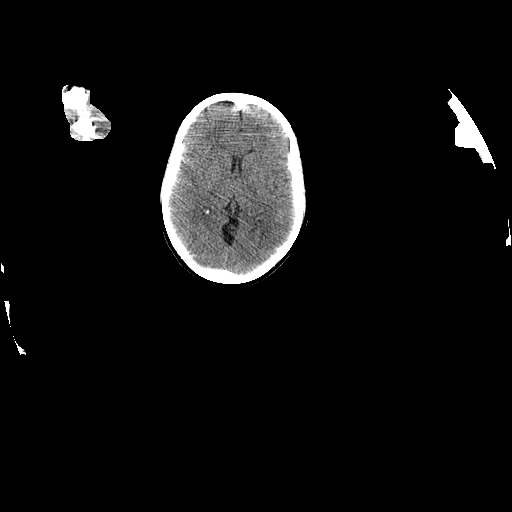

[Series 2: pet nac 2d · axial · 3.3mm · 4.69mm/px · z∈[-847,+23]mm · 7 of 267 slices shown]
[im 1/267]
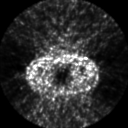
[im 45/267]
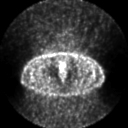
[im 89/267]
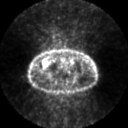
[im 134/267]
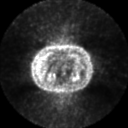
[im 178/267]
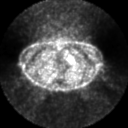
[im 222/267]
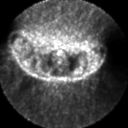
[im 267/267]
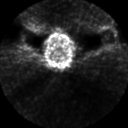

[Series 123: mip · coronal · 3.3mm · 4.69mm/px · 2 of 60 slices shown]
[im 1/60]
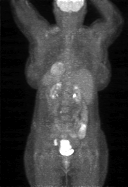
[im 60/60]
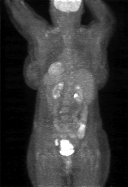

[Series 151: reformatted · axial · 3.3mm · 3.91mm/px · z∈[-847,+23]mm · 7 of 265 slices shown (1 of 2)]
[im 1/265]
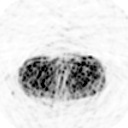
[im 45/265]
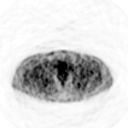
[im 89/265]
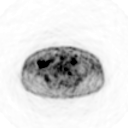
[im 133/265]
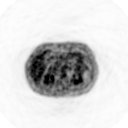
[im 177/265]
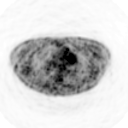
[im 221/265]
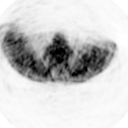
[im 265/265]
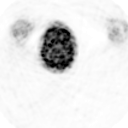

[Series 153: reformatted · coronal · 4.7mm · 6.98mm/px · 2 of 68 slices shown (2 of 2)]
[im 1/68]
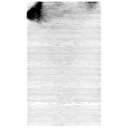
[im 68/68]
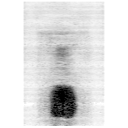

[25 of 25 positions shown; findings below may reference images not displayed]

FINDINGS: NECK

No hypermetabolic lymph nodes in the neck.

CHEST

No hypermetabolic mediastinal or hilar nodes. No suspicious
pulmonary nodules on the CT scan.

ABDOMEN/PELVIS

The solid abdominal organs are unremarkable. No findings for
metastatic disease. There is a large bulky cervical mass as
demonstrated on the recent CT scan. This demonstrates marked FDG
uptake with SUV max of 26.9. There is also an FDG positive (SUV max
6.4) 15 mm external iliac lymph node on the left and a smaller
positive (3.1 SUV max) left perirectal lymph node. A small
retroperitoneal lymph node located at the aortic bifurcation level
is also weakly FDG positive (SUV max 3.2).

The right adnexal lesion does not show any FDG uptake and could be
an exophytic fibroid or possibly a benign ovarian fibrothecoma.
There are small cyst associated with both ovaries.

SKELETON

No focal hypermetabolic activity to suggest skeletal metastasis.
IMPRESSION: 1. Large cervical mass with neoplastic range FDG uptake (SUV max
26.9).
2. FDG positive pelvic and retroperitoneal lymph nodes.
3. The 4.5 cm right adnexal mass does not demonstrate abnormal FDG
uptake and could be an exophytic fibroid or possibly an ovarian
fibrothecoma.

## 2014-05-21 ENCOUNTER — Ambulatory Visit: Payer: Managed Care, Other (non HMO) | Admitting: Gynecologic Oncology

## 2014-05-21 ENCOUNTER — Telehealth: Payer: Self-pay | Admitting: *Deleted

## 2014-05-21 NOTE — Telephone Encounter (Signed)
Returned call to patient regarding message left about canceling her appointment scheduled today(05/21/2014). Pt stated she can not keep this appointment because of the weather. Pt was offered a chance to reschedule and declined. Pt stated she would like to call and reschedule after she has her PET scan scheduled for March 2 by Dr. Bobby Rumpf

## 2014-06-07 ENCOUNTER — Encounter: Payer: Self-pay | Admitting: Gynecologic Oncology

## 2014-06-07 ENCOUNTER — Ambulatory Visit: Payer: Managed Care, Other (non HMO) | Attending: Gynecologic Oncology | Admitting: Gynecologic Oncology

## 2014-06-07 VITALS — BP 125/63 | HR 69 | Temp 98.0°F | Resp 18 | Ht 65.0 in | Wt 155.8 lb

## 2014-06-07 DIAGNOSIS — Z8589 Personal history of malignant neoplasm of other organs and systems: Secondary | ICD-10-CM | POA: Insufficient documentation

## 2014-06-07 DIAGNOSIS — Z8673 Personal history of transient ischemic attack (TIA), and cerebral infarction without residual deficits: Secondary | ICD-10-CM | POA: Insufficient documentation

## 2014-06-07 DIAGNOSIS — Z1272 Encounter for screening for malignant neoplasm of vagina: Secondary | ICD-10-CM | POA: Diagnosis not present

## 2014-06-07 DIAGNOSIS — Z9221 Personal history of antineoplastic chemotherapy: Secondary | ICD-10-CM | POA: Insufficient documentation

## 2014-06-07 DIAGNOSIS — L03317 Cellulitis of buttock: Secondary | ICD-10-CM | POA: Diagnosis not present

## 2014-06-07 DIAGNOSIS — N939 Abnormal uterine and vaginal bleeding, unspecified: Secondary | ICD-10-CM | POA: Insufficient documentation

## 2014-06-07 DIAGNOSIS — N952 Postmenopausal atrophic vaginitis: Secondary | ICD-10-CM | POA: Diagnosis not present

## 2014-06-07 DIAGNOSIS — Z8541 Personal history of malignant neoplasm of cervix uteri: Secondary | ICD-10-CM | POA: Diagnosis not present

## 2014-06-07 DIAGNOSIS — C539 Malignant neoplasm of cervix uteri, unspecified: Secondary | ICD-10-CM

## 2014-06-07 DIAGNOSIS — C775 Secondary and unspecified malignant neoplasm of intrapelvic lymph nodes: Secondary | ICD-10-CM

## 2014-06-07 DIAGNOSIS — Z8614 Personal history of Methicillin resistant Staphylococcus aureus infection: Secondary | ICD-10-CM | POA: Diagnosis not present

## 2014-06-07 DIAGNOSIS — Z923 Personal history of irradiation: Secondary | ICD-10-CM | POA: Diagnosis not present

## 2014-06-07 MED ORDER — SULFAMETHOXAZOLE-TRIMETHOPRIM 800-160 MG PO TABS: 1.0000 | ORAL_TABLET | Freq: Two times a day (BID) | ORAL | Status: DC

## 2014-06-07 NOTE — Patient Instructions (Signed)
Please take the antibiotic as prescribed.  Follow-up in 6 months    Thank you very much [name] for allowing me to provide care for you today.  I appreciate your confidence in choosing our Gynecologic Oncology team.  If you have any questions about your visit today please call our office and we will get back to you as soon as possible.  Please consider using the website Medlineplus.gov as an Geneticist, molecular.   Francetta Found. Sharni Negron MD., PhD Gynecologic Oncology

## 2014-06-07 NOTE — Progress Notes (Signed)
Office Visit:  GYN ONCOLOGY    Tammie Marsh 54 y.o. female  CC:  Cervical cancer   Assessment/Plan:  [name]  is a 54 y.o.  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  (Pelvic LN  mets noted on PET imaging) Definitive CDDP chemoradiaiton was completed in Feb 2015. She has received 4 ctycles adjuvant taxol/carboplaitn completed.10/03/2013  Reports an episode of vaginal bleeding this week.  Atrophic vagina noted without lesions.  Telangetitic area bx. PET CT already scheduled.  L buttock cellulitis Bactrim bid x 10 days. Advised to call if erythema worsens or she has episodes of febrility.       HPI: [name]  is a 54 y.o. last normal menstrual period approximately 2012.. She reports over 2014 she had severe abdominal cramping with intermittent episodes of heavy bleeding. She presented to emergency room on 03/13/2013 with heavy vaginal bleeding. A CT scan of the abdomen and pelvis was notable for a mass expanding the cervix and lower uterine segment measuring 7.9 x 4.7 cm x 3 cm a solid right adnexal mass measuring 5.5 cm in size was also noted this was thought to reflect a pedunculated fibroid. There is a single enhancing left external iliac lymph node measuring 14 mm in the short axis. Patient was taken to the operating room on 03/17/2013 at which time there was a biopsy performed of the cervical mass and an aborted hysteroscopy D&C. Pathology was consistent with squamous mucosa with high-grade squamous dysplasia suspicious for invasive squamous cell carcinoma.  Ms. [name] history is notable for a long history of abnormal Pap tests and previous treatments with cryotherapy and cold knife cone biopsy. The last cervical excision was in 2009. She states that her last abnormal Pap test was in 2012 and wnl.  A biopsy was collected in 03/17/2013 at [facility] indicated at least severe dysplasia squamous cell carcinoma in  situ. . On the presenting  physical examination there was a 7.5 cm cervix with extension to bilateral parametrial. The cervix appears barrel shaped and several additional biopsies were collected that confirmed  invasive squamous cell cancer.   The CT scan demonstrates possible left iliac lymph node involvement. A PET confirmed this suspicion.   The patient received her external beam radiation therapy at the [facility] in [place] received intracavitary brachytherapy treatments alone in [place]. January 15, January 21, January 26, February 4, June 07, 2013   She received 4 cycles taxol/carbo completed 10/03/2013  PET 11/14/2013 without evidence of hypermetabolic changes.  Benign right  adnexal process appreciated 3.8x4.7cm Reports vaginal spotting last week.   Reports cellulitis of the left buttocks for a few days at the site of prior MRSA infection.  No fever or chills  Social Hx:   History   Social History  . Marital Status: Single    Spouse Name: N/A  . Number of Children: N/A  . Years of Education: N/A   Occupational History  . Not on file.   Social History Main Topics  . Smoking status: Never Smoker   . Smokeless tobacco: Never Used  . Alcohol Use: No  . Drug Use: No  . Sexual Activity: No   Other Topics Concern  . Not on file   Social History Narrative    Past Surgical Hx:  Past Surgical History  Procedure Laterality Date  . Cervical biopsy  03/17/2013  . Cervical conization  w/bx  2009  . Tandem ring insertion N/A 05/11/2013    Procedure: EXAMINATION UNDER ANETHESIA, PLACEMENT OF GOLD MARKERS, PLACEMENT OF TANDEM RING FOR HIGH DOSE RADIATION THERAPY;  Surgeon: Blair Promise, MD;  Location: [facility];  Service: Urology;  Laterality: N/A;  . Tandem ring insertion N/A 05/17/2013    Procedure: TANDEM RING PLACEMENT;  Surgeon: Blair Promise, MD;  Location: [facility];  Service: Urology;  Laterality: N/A;  .  Tandem ring insertion N/A 05/22/2013    Procedure: TANDEM RING PLACEMENT;  Surgeon: Blair Promise, MD;  Location: [facility];  Service: Urology;  Laterality: N/A;  . Tandem ring insertion N/A 05/31/2013    Procedure: TANDEM RING INSERTION;  Surgeon: Blair Promise, MD;  Location: [facility];  Service: Urology;  Laterality: N/A;  . Tandem ring insertion N/A 06/07/2013    Procedure: TANDEM RING INSERTION;  Surgeon: Blair Promise, MD;  Location: [facility];  Service: Urology;  Laterality: N/A;    Past Medical Hx:  Past Medical History  Diagnosis Date  . Factor V Leiden mutation   . Anxiety   . Hypothyroidism   . ASCUS on Pap smear   . Ovarian cyst   . History of pulmonary embolism   . Cervical cancer dx 04-06-2013  [facility]/  [facility] DR Sondra Come FOR BRACHYTHERAPY (HIGH DOSE RADIATION VIA TANDEM RING)    STAGE IIB   T1b2, N1,  M0  invasive differentiated squamous cell carcinoma, FIGO  . MRSA (methicillin resistant staph aureus) culture positive     LEFT BUTTOCK AREA --  USING NEOSPORIN AND TAKING  ORAL ANTIBIOTICS  . S/P PICC central line placement   . Fatigue     SECONDARY TO CHEMORADIATION  . Complication of anesthesia     pt has better pain control w dilaudid than morphine  . Anemia   . History of radiation therapy 05/11/13, 05/17/13, 05/22/13, 05/31/13, 06/07/13    27.5 gray to cervix    Past Gynecological History: Gravida 0 menarche at 46 with regular menses.  Long standing history of abnormal Pap test with a report of several episodes of cryotherapy. Reports cold knife cone biopsy in 2009. Last abnormal Pap test per the patient was 2 years ago. No LMP recorded.  Family Hx:  Family History  Problem Relation Age of Onset  . Stroke Mother     Review of Systems: Constitutional  Feels well, Cardiovascular  No chest pain, shortness of breath, reports lower ext edema when standing Pulmonary   No cough or wheeze.  Gastro Intestinal  No nausea, vomitting, or diarrhoea. No bright red blood per rectum, no abdominal pain, change in bowel movement, or constipation.  Genito Urinary  No frequency, urgency, dysuria, episode of  vaginal bleeding last week. C/o skin infection of the left buttock. Musculo Skeletal  No myalgia, arthralgia, joint swelling or pain  Neurologic  No weakness,report mild  Numbness of both soles, no change in gait,  Psychology  Long standing depression   Vitals:  Blood pressure 125/63, pulse 69, temperature 98 F (36.7 C), temperature source Oral, resp. rate 18, height 5\' 5"  (1.651 m), weight 155 lb 12.8 oz (70.67 kg), SpO2 99 %.  Wt Readings from Last 3 Encounters:  06/07/14 155 lb 12.8 oz (70.67 kg)  06/07/13 132 lb (59.875 kg)  05/31/13 133 lb 8 oz (60.555 kg)    Physical Exam: WD in NAD  Neck  Supple NROM, without any enlargements.  Lymph Node Survey No cervical supraclavicular or inguinal adenopathy Cardiovascular  Pulse normal rate, regularity and rhythm. Lungs  Clear to auscultation bilateraly, Good air movement.  Psychiatry  Alert and oriented to person, flat affect, slow response, appropriate judgement, slow speech, slow reasoning Abdomen  Normoactive bowel sounds, abdomen soft, non-tender. Back: No CVA tenderness Breakdown of the skin with erythema over the left buttocks 7cm x 5 cm.  Opsite placed over the area of skin breakdown  Genito Urinary  Vulva/vagina: Normal external female genitalia.  No lesions. No discharge or bleeding.  Bladder/urethra:  No lesions or masses   Uterus mobile, cervix without lesions, deviated posteriorly, flush with the vaginal vault. , no palpable fibrosis, telangiectasias c/w radiotherapy visible and the area was biopsied.   Adnexa: No palpable masses. Rectal  Good tone, no masses fibrotic changes involving the parametria Extremities  2+ BLE edema

## 2014-06-08 ENCOUNTER — Telehealth: Payer: Self-pay | Admitting: Gynecologic Oncology

## 2014-06-08 NOTE — Telephone Encounter (Signed)
Returned call to patient and left message.  She called the office earlier asking why she could not have a hysterectomy.  Information left on message.  Advised to call the office for any concerns or questions.

## 2014-06-12 ENCOUNTER — Telehealth: Payer: Self-pay | Admitting: Gynecologic Oncology

## 2014-06-12 NOTE — Telephone Encounter (Signed)
Returned call to patient.  Informed of biopsy results.  PET scan scheduled for March 2 in [place].  Advised to call for any questions or concerns.

## 2014-06-14 ENCOUNTER — Ambulatory Visit: Payer: Managed Care, Other (non HMO) | Admitting: Gynecologic Oncology

## 2014-07-03 ENCOUNTER — Telehealth: Payer: Self-pay | Admitting: *Deleted

## 2014-07-03 NOTE — Telephone Encounter (Signed)
Pt advised "My PET scan was turned down, it scared me to death and I almost left my job. I called the insurance company and raised cain, they said they had to have a peer to peer with Dr. Bobby Rumpf, and now I don't know if he's going to talk to them. I've been turned down for both of them ( PET and CT). I need to know if it's true, I need to know if I have cancer again. I called the insurance company and they said there was no peer to peer done. I was told there is a 40% chance the cancer will come back and I want to know why they wont scan me."   Discussed with pt to call Dr. Bobby Rumpf office and discuss her concerns about insurance company denying scans and advising no peer to peer was done and what is the next step. Explained to pt insurance companies have specific criteria on which they base if scans are or are not approved. I informed pt I do not know what her insurance company's rules are however many insurance companies are not allowing pt's to have scans as often as before. Pt verbalized understanding of above discussion, thanked me for the call. I informed pt I will pass her concerns on to Dr. [name].  No further concerns at this time.

## 2014-11-12 ENCOUNTER — Telehealth: Payer: Self-pay | Admitting: Nurse Practitioner

## 2014-11-12 NOTE — Telephone Encounter (Signed)
Patient calling to make f/u apt with Dr. [name]. She works on the day Dr. [name] will be in clinic in August so pt requesting sooner apt with one of her colleagues. Apt made 11/23/14 at 10:45 with Dr. Fermin Schwab. She verbalizes understanding and awareness of apt.

## 2014-11-23 ENCOUNTER — Encounter: Payer: Self-pay | Admitting: Gynecology

## 2014-11-23 ENCOUNTER — Ambulatory Visit: Payer: Managed Care, Other (non HMO) | Attending: Gynecology | Admitting: Gynecology

## 2014-11-23 VITALS — BP 122/75 | HR 74 | Temp 98.5°F | Resp 18 | Ht 65.0 in | Wt 140.7 lb

## 2014-11-23 DIAGNOSIS — C539 Malignant neoplasm of cervix uteri, unspecified: Secondary | ICD-10-CM | POA: Insufficient documentation

## 2014-11-23 NOTE — Addendum Note (Signed)
Addended by: Christa See on: 11/23/2014 02:14 PM   Modules accepted: Orders

## 2014-11-23 NOTE — Progress Notes (Signed)
Office Visit:  GYN ONCOLOGY    BOSTYN KUNKLER 54 y.o. female  CC:  Cervical cancer stage I B2.  Peripheral neuropathy.  Chronic C. difficile infection.   Assessment/Plan:  Ms. ALISSANDRA GEOFFROY  is a 55 y.o.  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  (Pelvic LN  mets noted on PET imaging) Definitive CDDP chemoradiaiton was completed in Feb 2015. She has received 4 c ycles adjuvant taxol/carboplaitn completed.10/03/2013. She's clinically free of disease. Pap smears are obtained. Return to see [place] in 6 months.   Peripheral neuropathy secondary to chemotherapy. This is slightly improving according the patient's history all oh she wishes to see a neurologist. We will refer the patient to a neurologist in [place].  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  Interval history The patient returns as previously scheduled for follow-up. Since her last visit overall she's done well. She denies any pelvic pain pressure vaginal bleeding or discharge. She apparently developed a chronic C. difficile infection treated 5 times with antibiotics. Given that this problem did not resolve she underwent a fecal transplant at [facility] yesterday.  She has peripheral neuropathy which is slightly improved.  She has no other GI or GU symptoms.  HPI: Ms. ZAAKIRAH KISTNER  is a 54 y.o. last normal menstrual period approximately 2012.. She reports over 2014 she had severe abdominal cramping with intermittent episodes of heavy bleeding. She presented to emergency room on 03/13/2013 with heavy vaginal bleeding. A CT scan of the abdomen and pelvis was notable for a mass expanding the cervix and lower uterine segment measuring 7.9 x 4.7 cm x 3 cm a solid right adnexal mass measuring 5.5 cm in size was also noted this was thought to reflect a pedunculated fibroid. There is a single enhancing left external iliac lymph node measuring 14 mm in the short axis. Patient was taken to the operating room  on 03/17/2013 at which time there was a biopsy performed of the cervical mass and an aborted hysteroscopy D&C. Pathology was consistent with squamous mucosa with high-grade squamous dysplasia suspicious for invasive squamous cell carcinoma.  Ms. Secrist history is notable for a long history of abnormal Pap tests and previous treatments with cryotherapy and cold knife cone biopsy. The last cervical excision was in 2009. She states that her last abnormal Pap test was in 2012 and wnl.  A biopsy was collected in 03/17/2013 at [facility] indicated at least severe dysplasia squamous cell carcinoma in situ. . On the presenting  physical examination there was a 7.5 cm cervix with extension to bilateral parametrial. The cervix appears barrel shaped and several additional biopsies were collected that confirmed  invasive squamous cell cancer.   The CT scan demonstrates possible left iliac lymph node involvement. A PET confirmed this suspicion.   The patient received her external beam radiation therapy at the [facility] in [place] received intracavitary brachytherapy treatments alone in [place]. January 15, January 21, January 26, February 4, June 07, 2013   She received 4 cycles taxol/carbo completed 10/03/2013  PET 11/14/2013 without evidence of hypermetabolic changes.  Benign right  adnexal process appreciated 3.8x4.7cm   Social Hx:   History   Social History  . Marital Status: Single    Spouse Name: N/A  . Number of Children: N/A  . Years of Education: N/A   Occupational History  . Not on file.   Social History Main Topics  . Smoking status:  Never Smoker   . Smokeless tobacco: Never Used  . Alcohol Use: No  . Drug Use: No  . Sexual Activity: No   Other Topics Concern  . Not on file   Social History Narrative    Past Surgical Hx:  Past Surgical History  Procedure Laterality Date  . Cervical biopsy  03/17/2013  . Cervical conization w/bx  2009  .  Tandem ring insertion N/A 05/11/2013    Procedure: EXAMINATION UNDER ANETHESIA, PLACEMENT OF GOLD MARKERS, PLACEMENT OF TANDEM RING FOR HIGH DOSE RADIATION THERAPY;  Surgeon: Blair Promise, MD;  Location: [facility];  Service: Urology;  Laterality: N/A;  . Tandem ring insertion N/A 05/17/2013    Procedure: TANDEM RING PLACEMENT;  Surgeon: Blair Promise, MD;  Location: [facility];  Service: Urology;  Laterality: N/A;  . Tandem ring insertion N/A 05/22/2013    Procedure: TANDEM RING PLACEMENT;  Surgeon: Blair Promise, MD;  Location: [facility];  Service: Urology;  Laterality: N/A;  . Tandem ring insertion N/A 05/31/2013    Procedure: TANDEM RING INSERTION;  Surgeon: Blair Promise, MD;  Location: [facility];  Service: Urology;  Laterality: N/A;  . Tandem ring insertion N/A 06/07/2013    Procedure: TANDEM RING INSERTION;  Surgeon: Blair Promise, MD;  Location: [facility];  Service: Urology;  Laterality: N/A;    Past Medical Hx:  Past Medical History  Diagnosis Date  . Factor V Leiden mutation   . Anxiety   . Hypothyroidism   . ASCUS on Pap smear   . Ovarian cyst   . History of pulmonary embolism   . Cervical cancer dx 04-06-2013  [facility]/  [facility] DR Sondra Come FOR BRACHYTHERAPY (HIGH DOSE RADIATION VIA TANDEM RING)    STAGE IIB   T1b2, N1,  M0  invasive differentiated squamous cell carcinoma, FIGO  . MRSA (methicillin resistant staph aureus) culture positive     LEFT BUTTOCK AREA --  USING NEOSPORIN AND TAKING  ORAL ANTIBIOTICS  . S/P PICC central line placement   . Fatigue     SECONDARY TO CHEMORADIATION  . Complication of anesthesia     pt has better pain control w dilaudid than morphine  . Anemia   . History of radiation therapy 05/11/13, 05/17/13, 05/22/13, 05/31/13, 06/07/13    27.5 gray to cervix    Past Gynecological History: Gravida 0 menarche at 67 with  regular menses.  Long standing history of abnormal Pap test with a report of several episodes of cryotherapy. Reports cold knife cone biopsy in 2009. Last abnormal Pap test per the patient was 2 years ago. No LMP recorded.  Family Hx:  Family History  Problem Relation Age of Onset  . Stroke Mother     Review of Systems: Constitutional  Feels well, Cardiovascular  No chest pain, shortness of breath, reports lower ext edema when standing Pulmonary  No cough or wheeze.  Gastro Intestinal  No nausea, vomitting, or diarrhoea. No bright red blood per rectum, no abdominal pain, change in bowel movement, or constipation.  Genito Urinary  No frequency, urgency, dysuria, episode of  vaginal bleeding last week. C/o skin infection of the left buttock. Musculo Skeletal  No myalgia, arthralgia, joint swelling or pain  Neurologic  No weakness,report mild  Numbness of both soles, no change in gait,  Psychology  Long standing depression   Vitals:  Blood pressure 122/75, pulse 74,  temperature 98.5 F (36.9 C), temperature source Oral, resp. rate 18, height 5\' 5"  (1.651 m), weight 140 lb 11.2 oz (63.821 kg), SpO2 99 %.  Wt Readings from Last 3 Encounters:  11/23/14 140 lb 11.2 oz (63.821 kg)  06/07/14 155 lb 12.8 oz (70.67 kg)  07/27/13 137 lb 6.4 oz (62.324 kg)    Physical Exam: WD in NAD Neck  Supple NROM, without any enlargements.  Lymph Node Survey No cervical supraclavicular or inguinal adenopathy Cardiovascular  Pulse normal rate, regularity and rhythm. Lungs  Clear to auscultation bilateraly, Good air movement.  Psychiatry  Alert and oriented to person, flat affect, slow response, appropriate judgement, slow speech, slow reasoning Abdomen  Normoactive bowel sounds, abdomen soft, non-tender. Back: No CVA tenderness Breakdown of the skin with erythema over the left buttocks 7cm x 5 cm.  Opsite placed over the area of skin breakdown  Genito Urinary  Vulva/vagina: Normal  external female genitalia.  No lesions. No discharge or bleeding.  Bladder/urethra:  No lesions or masses   Uterus mobile, cervix without lesions, deviated posteriorly, flush with the vaginal vault. , no palpable fibrosis, telangiectasias c/w radiotherapy visible and the area was biopsied.   Adnexa: No palpable masses. Rectal  Good tone, no masses fibrotic changes involving the parametria Extremities  1+ BLE edema

## 2014-11-23 NOTE — Patient Instructions (Signed)
We will contact you with your pap smear results and your appointment with a neurologist in [place].  Plan to follow up with Dr. Fermin Schwab in six months or sooner if needed.  Please call in October or November 2016 to schedule an appointment.  Please call for any questions or concerns.

## 2014-11-26 ENCOUNTER — Telehealth: Payer: Self-pay | Admitting: *Deleted

## 2014-11-26 NOTE — Telephone Encounter (Signed)
Message left on patient's voicemail  with appointment details. Pt is scheduled to see Dr. Charlaine Dalton @  [facility] in [place] on August 26 @ 1:30.

## 2014-11-27 LAB — CYTOLOGY - PAP

## 2014-12-03 ENCOUNTER — Telehealth: Payer: Self-pay | Admitting: *Deleted

## 2014-12-03 NOTE — Telephone Encounter (Signed)
Per Dr. Fermin Schwab, called patient to notify her that the sample taken was inadequate for a pap smear at her last office visit on 11/23/14 and that Dr. Fermin Schwab will repeat at next scheduled visit (January 2016).   Patient did not answer so left VM requesting return call.

## 2014-12-07 NOTE — Telephone Encounter (Signed)
Called and reached patient by phone. Notified her of information below and she is agreeable with plan to repeat pap smear at next visit in January 2017. No questions or concerns noted at this time.

## 2015-01-04 ENCOUNTER — Telehealth: Payer: Self-pay | Admitting: Nurse Practitioner

## 2015-01-04 NOTE — Telephone Encounter (Signed)
Patient calling to report light vaginal bleeding noted on feminine pad and "when I wipe." patient is scared and doesn't know what to do.  Notified Joylene John, NP, who recommends rescheduling patient to come to clinic on next clinic day. However, patient has to work Monday and opts to keep apt on 01/14/15. She is instructed to monitor bleeding and call clinic with increasing bleeding or changes from her baseline. She wonders if a stressful family event this week may have caused her bleeding. She is informed that she will have a physical exam at her apt where the doctor will try to determine the reason for her bleeding. She is told to report to the ED if bleeding increases to an amount that requires multiple pad changes per hour; she is asked to check in with our clinic next week and update [place] on how she is doing. She verbalizes understanding of the above.

## 2015-01-14 ENCOUNTER — Ambulatory Visit: Payer: Managed Care, Other (non HMO) | Admitting: Gynecologic Oncology

## 2015-01-14 ENCOUNTER — Telehealth: Payer: Self-pay | Admitting: *Deleted

## 2015-01-14 NOTE — Telephone Encounter (Signed)
Called pt to r/s 9/19 appt. Pt advised she woke up late. R/s pt appt to 9/29  10:45am, per pt request as she is off this day. No further concerns at this time.

## 2015-01-24 ENCOUNTER — Ambulatory Visit: Payer: Managed Care, Other (non HMO) | Admitting: Gynecologic Oncology

## 2015-01-24 ENCOUNTER — Telehealth: Payer: Self-pay | Admitting: *Deleted

## 2015-01-24 NOTE — Telephone Encounter (Signed)
A message was left on office voicemail  By patient requesting that her appointment be canceled because she is not feeling well. Patient also stated she  will call back to reschedule at a later date.

## 2015-02-01 ENCOUNTER — Ambulatory Visit: Payer: Managed Care, Other (non HMO) | Admitting: Gynecologic Oncology

## 2015-02-01 ENCOUNTER — Telehealth: Payer: Self-pay | Admitting: *Deleted

## 2015-02-01 NOTE — Telephone Encounter (Signed)
Patient called to cancel her appointment scheduled today with Dr. Denman George @ 1:30,  stating "I can't come today, can I be rescheduled"? Pt requested to be rescheduled with Dr. [name]. An appointment has been scheduled with Dr. [name] @ 3:15 on 02/07/15

## 2015-02-07 ENCOUNTER — Encounter: Payer: Self-pay | Admitting: Gynecologic Oncology

## 2015-02-07 ENCOUNTER — Ambulatory Visit: Payer: Managed Care, Other (non HMO) | Attending: Gynecologic Oncology | Admitting: Gynecologic Oncology

## 2015-02-07 VITALS — BP 128/54 | HR 64 | Temp 97.9°F | Resp 18 | Ht 65.0 in | Wt 142.3 lb

## 2015-02-07 DIAGNOSIS — Z9221 Personal history of antineoplastic chemotherapy: Secondary | ICD-10-CM | POA: Diagnosis not present

## 2015-02-07 DIAGNOSIS — Z8541 Personal history of malignant neoplasm of cervix uteri: Secondary | ICD-10-CM

## 2015-02-07 DIAGNOSIS — Z923 Personal history of irradiation: Secondary | ICD-10-CM | POA: Diagnosis not present

## 2015-02-07 DIAGNOSIS — N939 Abnormal uterine and vaginal bleeding, unspecified: Secondary | ICD-10-CM | POA: Insufficient documentation

## 2015-02-07 DIAGNOSIS — C539 Malignant neoplasm of cervix uteri, unspecified: Secondary | ICD-10-CM | POA: Diagnosis not present

## 2015-02-07 NOTE — Patient Instructions (Addendum)
We will call you with the results of your pap smear from today. Plan to follow-up with Dr. [name] in February 2017. Please call our office if you experience any more episodes of vaginal bleeding or have any additional concerns prior to your scheduled appt.

## 2015-02-07 NOTE — Progress Notes (Signed)
Office Visit:  GYN ONCOLOGY    Tammie Marsh 54 y.o. female  CC:  Cervical cancer stage I B2.   Assessment/Plan:  [name]  is a 54 y.o.  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  (Pelvic LN  mets noted on PET imaging) Definitive CDDP chemoradiaiton was completed in Feb 2015. She has received 4 c ycles adjuvant taxol/carboplaitn completed.10/03/2013. She's clinically free of disease. Pap  Test collected  Return to see [place] in 3 months.  If there is a further episode of vaginal bleeding will obtain UTZ to evaluate for hematometria.  Vaginal bleeding likely due to radiation changes.   Interval history The patient returns as previously scheduled for follow-up. Since her last visit overall she's done well. She denies any pelvic pain pressure vaginal bleeding or discharge. She apparently developed a chronic C. difficile infection treated 5 times with antibiotics. Given that this problem did not resolve she underwent a fecal transplant at [facility] yesterday.  She has peripheral neuropathy which is slightly improved.  She has no other GI or GU symptoms.  HPI: [name]  is a 54 y.o. last normal menstrual period approximately 2012.. She reports over 2014 she had severe abdominal cramping with intermittent episodes of heavy bleeding. She presented to emergency room on 03/13/2013 with heavy vaginal bleeding. A CT scan of the abdomen and pelvis was notable for a mass expanding the cervix and lower uterine segment measuring 7.9 x 4.7 cm x 3 cm a solid right adnexal mass measuring 5.5 cm in size was also noted this was thought to reflect a pedunculated fibroid. There is a single enhancing left external iliac lymph node measuring 14 mm in the short axis. Patient was taken to the operating room on 03/17/2013 at which time there was a biopsy performed of the cervical mass and an aborted hysteroscopy D&C. Pathology was consistent with squamous mucosa with high-grade squamous  dysplasia suspicious for invasive squamous cell carcinoma.  [facility] history is notable for a long history of abnormal Pap tests and previous treatments with cryotherapy and cold knife cone biopsy. The last cervical excision was in 2009. She states that her last abnormal Pap test was in 2012 and wnl.  A biopsy was collected in 03/17/2013 at [facility] indicated at least severe dysplasia squamous cell carcinoma in situ. . On the presenting  physical examination there was a 7.5 cm cervix with extension to bilateral parametrial. The cervix appears barrel shaped and several additional biopsies were collected that confirmed  invasive squamous cell cancer.   The CT scan demonstrates possible left iliac lymph node involvement. A PET confirmed this suspicion.   The patient received her external beam radiation therapy at the [facility] in [place] received intracavitary brachytherapy treatments alone in [place]. January 15, January 21, January 26, February 4, June 07, 2013   She received 4 cycles taxol/carbo completed 10/03/2013  PET 11/14/2013 without evidence of hypermetabolic changes.  Benign right  adnexal process appreciated 3.8x4.7cm Pap 05/2014  Pap 11/23/2014 unsatisfactory.  Presents for repeat Pap.  Reports self limiting episode of vaginal bleeding 2 weeks ago.  No hematochezia, no hematuria, no nausea or emesis, no abdominal pain.   Social Hx:   Social History   Social History  . Marital Status: Single    Spouse Name: N/A  . Number of Children: N/A  . Years of Education: N/A   Occupational History  . Not on  file.   Social History Main Topics  . Smoking status: Never Smoker   . Smokeless tobacco: Never Used  . Alcohol Use: No  . Drug Use: No  . Sexual Activity: No   Other Topics Concern  . Not on file   Social History Narrative    Past Surgical Hx:  Past Surgical History  Procedure Laterality Date  . Cervical biopsy  03/17/2013  .  Cervical conization w/bx  2009  . Tandem ring insertion N/A 05/11/2013    Procedure: EXAMINATION UNDER ANETHESIA, PLACEMENT OF GOLD MARKERS, PLACEMENT OF TANDEM RING FOR HIGH DOSE RADIATION THERAPY;  Surgeon: Blair Promise, MD;  Location: [facility];  Service: Urology;  Laterality: N/A;  . Tandem ring insertion N/A 05/17/2013    Procedure: TANDEM RING PLACEMENT;  Surgeon: Blair Promise, MD;  Location: [facility];  Service: Urology;  Laterality: N/A;  . Tandem ring insertion N/A 05/22/2013    Procedure: TANDEM RING PLACEMENT;  Surgeon: Blair Promise, MD;  Location: [facility];  Service: Urology;  Laterality: N/A;  . Tandem ring insertion N/A 05/31/2013    Procedure: TANDEM RING INSERTION;  Surgeon: Blair Promise, MD;  Location: [facility];  Service: Urology;  Laterality: N/A;  . Tandem ring insertion N/A 06/07/2013    Procedure: TANDEM RING INSERTION;  Surgeon: Blair Promise, MD;  Location: [facility];  Service: Urology;  Laterality: N/A;    Past Medical Hx:  Past Medical History  Diagnosis Date  . Factor V Leiden mutation ([place])   . Anxiety   . Hypothyroidism   . ASCUS on Pap smear   . Ovarian cyst   . History of pulmonary embolism   . Cervical cancer ([place]) dx 04-06-2013  [facility]/  [facility] DR Sondra Come FOR BRACHYTHERAPY (HIGH DOSE RADIATION VIA TANDEM RING)    STAGE IIB   T1b2, N1,  M0  invasive differentiated squamous cell carcinoma, FIGO  . MRSA (methicillin resistant staph aureus) culture positive     LEFT BUTTOCK AREA --  USING NEOSPORIN AND TAKING  ORAL ANTIBIOTICS  . S/P PICC central line placement   . Fatigue     SECONDARY TO CHEMORADIATION  . Complication of anesthesia     pt has better pain control w dilaudid than morphine  . Anemia   . History of radiation therapy 05/11/13, 05/17/13, 05/22/13, 05/31/13, 06/07/13    27.5 gray to cervix    Past  Gynecological History: Gravida 0 menarche at 80 with regular menses.  Long standing history of abnormal Pap test with a report of several episodes of cryotherapy. Reports cold knife cone biopsy in 2009.  No LMP recorded.  Family Hx:  Family History  Problem Relation Age of Onset  . Stroke Mother     Review of Systems: Constitutional  Feels well, Cardiovascular  No chest pain, shortness of breath,  Pulmonary  No cough or wheeze.  Gastro Intestinal  No nausea, vomitting, or diarrhoea. No bright red blood per rectum, no abdominal pain, change in bowel movement, or constipation.  Genito Urinary  No frequency, urgency, dysuria, episode of  vaginal spotting two weeks ago. Musculo Skeletal  No myalgia, arthralgia, joint swelling or pain  Neurologic  No weakness,report mild   no change in gait,  Psychology  Long standing depression   Vitals:  Blood pressure 128/54, pulse 64, temperature 97.9 F (36.6 C), temperature source Oral, resp. rate 18,  height 5\' 5"  (1.651 m), weight 142 lb 4.8 oz (64.547 kg), SpO2 100 %.  Wt Readings from Last 3 Encounters:  02/07/15 142 lb 4.8 oz (64.547 kg)  11/23/14 140 lb 11.2 oz (63.821 kg)  06/07/14 155 lb 12.8 oz (70.67 kg)  Body mass index is 23.68 kg/(m^2).   Physical Exam: WD in NAD Neck  Supple NROM, without any enlargements.  Lymph Node Survey No cervical supraclavicular or inguinal adenopathy Cardiovascular  Pulse normal rate, regularity and rhythm. Lungs  Clear to auscultation bilateraly, Good air movement.  Psychiatry  Alert and oriented to person, flat affect, slow response, appropriate judgement, slow speech, slow reasoning Abdomen  Normoactive bowel sounds, abdomen soft, non-tender. Back: No CVA tenderness Genito Urinary  Vulva/vagina: Normal external female genitalia.  No lesions. Extremely atrophic vagina with radiation changes. No discharge or bleeding.  Bladder/urethra:  No lesions or masses   Uterus mobile, cervix  without lesions, flush with the vaginal vault. , no palpable fibrosis, telangiectasias c/w radiotherapy Pap collected with small amount of bleeding. (Medium speculum) Adnexa: No palpable masses. Rectal  Good tone, no masses fibrotic changes involving the parametria Extremities  1+ BLE edema

## 2015-02-08 ENCOUNTER — Other Ambulatory Visit (HOSPITAL_COMMUNITY)
Admission: RE | Admit: 2015-02-08 | Discharge: 2015-02-08 | Disposition: A | Discharge status: Home / Self Care | Payer: Managed Care, Other (non HMO) | Source: Ambulatory Visit | Attending: Gynecologic Oncology | Admitting: Gynecologic Oncology

## 2015-02-08 DIAGNOSIS — Z01411 Encounter for gynecological examination (general) (routine) with abnormal findings: Secondary | ICD-10-CM | POA: Diagnosis not present

## 2015-02-08 NOTE — Addendum Note (Signed)
Addended by: Joylene John D on: 02/08/2015 09:08 AM   Modules accepted: Orders

## 2015-02-12 LAB — CYTOLOGY - PAP

## 2015-02-13 ENCOUNTER — Telehealth: Payer: Self-pay

## 2015-02-13 NOTE — Telephone Encounter (Signed)
Orders received from [place] to contact the patient to update with PAP results are "normal" from visit on 02/08/15 with Dr [name] . Patient contacted , no answer , left a detailed message with results and call back number if additional questions arise.

## 2015-03-04 ENCOUNTER — Other Ambulatory Visit: Payer: Self-pay | Admitting: Gynecologic Oncology

## 2015-03-04 ENCOUNTER — Telehealth: Payer: Self-pay

## 2015-03-04 NOTE — Telephone Encounter (Signed)
Patient's call returned to update with recommendation for c/o "blood' noted , only when wiping after using the using the bathroom and "abdominal" pain . Patient contacted at her place of employment [phone]  , no answer , left a detailed message to continue with current medication regime and monitor the bleeding and call tomorrow to update . Patient also instructed to call back to if the bleeding increases , call back information given.

## 2015-03-05 ENCOUNTER — Telehealth: Payer: Self-pay | Admitting: Gynecologic Oncology

## 2015-03-05 DIAGNOSIS — N939 Abnormal uterine and vaginal bleeding, unspecified: Secondary | ICD-10-CM

## 2015-03-05 DIAGNOSIS — C539 Malignant neoplasm of cervix uteri, unspecified: Secondary | ICD-10-CM

## 2015-03-05 NOTE — Telephone Encounter (Signed)
Left message asking patient if she would like to have her ultrasound at [facility] or in [place] to call the office so the appt could be arranged.

## 2015-03-06 ENCOUNTER — Telehealth: Payer: Self-pay | Admitting: Gynecologic Oncology

## 2015-03-06 NOTE — Telephone Encounter (Signed)
Called to check in with patient and see where she would like her ultrasound scheduled since she lives in [place].  Ultrasound to be ordered per Dr. [name] since she is having vaginal bleeding.  Advised patient to please call the office.

## 2015-03-06 NOTE — Addendum Note (Signed)
Addended by: Joylene John D on: 03/06/2015 12:19 PM   Modules accepted: Orders

## 2015-03-06 NOTE — Telephone Encounter (Signed)
Patient returned call to the office stating she would prefer to have her ultrasound done at [facility] since she lives in [place].  We will contact [facility] and call her back with a date and time for her ultrasound.  Tuesday, Nov 15 in the afternoon would be best for the pt.

## 2015-03-07 ENCOUNTER — Telehealth: Payer: Self-pay | Admitting: *Deleted

## 2015-03-07 NOTE — Telephone Encounter (Signed)
Notified patient of scheduled appointment. Pt is scheduled for an [place] at [facility] on Nov 15,2016 @ 3:00. Pt was instructed to arrive at 2:30 with a full bladder. Pt agreed with time, date and location.

## 2015-03-13 ENCOUNTER — Telehealth: Payer: Self-pay | Admitting: Gynecologic Oncology

## 2015-03-14 NOTE — Telephone Encounter (Signed)
[place] results sent to Dr. Janie Morning at [facility].  Per email exchange, Dr. [name] to call the patient.

## 2015-03-15 ENCOUNTER — Telehealth: Payer: Self-pay | Admitting: Gynecologic Oncology

## 2015-03-15 NOTE — Telephone Encounter (Signed)
Message left for patient with [place] results.  Advised to call the office for any questions or concerns.  Patient had called this am stating she had missed Dr. Leone Brand phone call and Dr. [name] advised her to contact the office to arrange a time to discuss [place] and symptoms.  Message sent to Dr. [name] at [facility] via email that the patient had called back this am.

## 2015-04-02 ENCOUNTER — Telehealth: Payer: Self-pay | Admitting: Gynecologic Oncology

## 2015-04-02 NOTE — Telephone Encounter (Signed)
Patient called and spoke with Franki Cabot, administrative assistant.  Patient stating she had received a message from Dr. [name] about taking vaginal estrogen but she did not think she could take that.  Wanting to speak with Dr. [name].  Message sent to Dr. [name] at [facility] this am.

## 2015-04-02 NOTE — Telephone Encounter (Signed)
Attempted to return call to patient.  Message left asking her to call the office.

## 2015-05-30 ENCOUNTER — Ambulatory Visit: Payer: Managed Care, Other (non HMO) | Admitting: Gynecologic Oncology

## 2015-06-27 ENCOUNTER — Ambulatory Visit: Payer: Managed Care, Other (non HMO) | Admitting: Gynecologic Oncology

## 2015-06-27 ENCOUNTER — Telehealth: Payer: Self-pay | Admitting: *Deleted

## 2015-06-27 NOTE — Telephone Encounter (Signed)
Pt called to cancel her appointment with Dr. [name] on today,stating she has the flu. Pt declined to reschedule at this time .

## 2015-07-12 ENCOUNTER — Telehealth: Payer: Self-pay | Admitting: Gynecologic Oncology

## 2015-07-12 ENCOUNTER — Telehealth: Payer: Self-pay

## 2015-07-12 NOTE — Telephone Encounter (Signed)
Patient called stating she could not see Dr. [name] on April 20 because she was working.  She was given the next available dates for Dr. [name] but states she is working of those days as well.  She is advised that she could go to [facility] to see Dr. [name] as well but she is refusing to do that.  Advised that she could see one of Dr. Leone Brand partners with her response being "I don't want to see him.  I don't think he did a good job last time."  Advised she could see Dr. Everitt Amber, another one of Dr. Leone Brand partners because we would not want to delay her care.  Appt made with Dr. Denman George for April 3.  Advised to call for any questions or concerns or if her appt needed to be adjusted.

## 2015-07-12 NOTE — Telephone Encounter (Signed)
Returned call to assist the patient with scheduling a follow up appointment , no answer , left a detailed message with call back information provided.

## 2015-07-16 ENCOUNTER — Telehealth: Payer: Self-pay | Admitting: *Deleted

## 2015-07-16 NOTE — Telephone Encounter (Signed)
Pt called to verify her appointment date and time. Pt is scheduled for 07/29/2015 @ 3:45pm. Pt inquired about alternate available appointment dates with Dr. [name] . Dr Leone Brand next available clinic date is 08/15/2015. Pt stated "I can't come that date I have to work. The next date given was 08/29/2015. Pt was advised that she is already behind on her follow up due to 2 previous  Appointments canceled by her. Pt agreed to come on 07/29/15 to see Dr. Denman George

## 2015-07-16 NOTE — Telephone Encounter (Signed)
Additional information to previous telephone note: Pt wanted to make sure that Dr. [name] knew that she was not missing her appointments on purpose is was just her schedule. The patient also wanted Dr [name]  to know that she  is seeing a counselor to discuss her feelings since having cancer. Pt states "  Please tell Dr. [name] I have a little blood but only when I use my dilator" Message will be relayed to Dr. [name]

## 2015-07-25 ENCOUNTER — Ambulatory Visit: Payer: Managed Care, Other (non HMO) | Attending: Gynecologic Oncology | Admitting: Gynecologic Oncology

## 2015-07-25 ENCOUNTER — Encounter: Payer: Self-pay | Admitting: Gynecologic Oncology

## 2015-07-25 VITALS — BP 132/63 | HR 63 | Temp 98.0°F | Resp 18 | Ht 65.0 in | Wt 144.0 lb

## 2015-07-25 DIAGNOSIS — Z8541 Personal history of malignant neoplasm of cervix uteri: Secondary | ICD-10-CM | POA: Diagnosis not present

## 2015-07-25 DIAGNOSIS — F419 Anxiety disorder, unspecified: Secondary | ICD-10-CM | POA: Diagnosis not present

## 2015-07-25 DIAGNOSIS — N898 Other specified noninflammatory disorders of vagina: Secondary | ICD-10-CM

## 2015-07-25 DIAGNOSIS — D649 Anemia, unspecified: Secondary | ICD-10-CM | POA: Insufficient documentation

## 2015-07-25 DIAGNOSIS — Z923 Personal history of irradiation: Secondary | ICD-10-CM | POA: Diagnosis not present

## 2015-07-25 DIAGNOSIS — C539 Malignant neoplasm of cervix uteri, unspecified: Secondary | ICD-10-CM | POA: Diagnosis present

## 2015-07-25 DIAGNOSIS — N939 Abnormal uterine and vaginal bleeding, unspecified: Secondary | ICD-10-CM | POA: Diagnosis not present

## 2015-07-25 DIAGNOSIS — A4902 Methicillin resistant Staphylococcus aureus infection, unspecified site: Secondary | ICD-10-CM | POA: Diagnosis not present

## 2015-07-25 DIAGNOSIS — Z86711 Personal history of pulmonary embolism: Secondary | ICD-10-CM | POA: Diagnosis not present

## 2015-07-25 DIAGNOSIS — G620 Drug-induced polyneuropathy: Secondary | ICD-10-CM

## 2015-07-25 DIAGNOSIS — C538 Malignant neoplasm of overlapping sites of cervix uteri: Secondary | ICD-10-CM

## 2015-07-25 DIAGNOSIS — E039 Hypothyroidism, unspecified: Secondary | ICD-10-CM | POA: Diagnosis not present

## 2015-07-25 DIAGNOSIS — D6851 Activated protein C resistance: Secondary | ICD-10-CM | POA: Insufficient documentation

## 2015-07-25 DIAGNOSIS — N83209 Unspecified ovarian cyst, unspecified side: Secondary | ICD-10-CM | POA: Diagnosis not present

## 2015-07-25 DIAGNOSIS — T451X5A Adverse effect of antineoplastic and immunosuppressive drugs, initial encounter: Secondary | ICD-10-CM

## 2015-07-25 NOTE — Patient Instructions (Signed)
Plan to follow up in October with a pap smear at that time or sooner if needed or for any new symptoms.  PLEASE CALL OUR OFFICE IN TWO TO THREE WEEKS TO SCHEDULE FOR October 2017.  You may have some spotting after your exam today.  Please call our office for any questions or concerns.

## 2015-07-25 NOTE — Progress Notes (Addendum)
Office Visit:  GYN ONCOLOGY    Tammie Marsh 55 y.o. female  CC:  Cervical cancer stage I B2.     Assessment/Plan:  Tammie Marsh  is a 55 y.o.  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  (Pelvic LN  mets noted on PET imaging) Definitive CDDP chemoradiaiton was completed in Feb 2015. She has received 4 c ycles adjuvant taxol/carboplaitn completed.10/03/2013. She's clinically free of disease.   Return to see [place] 01/2016.  [name] reports that Dr. Bobby Rumpf plans to assess her with imaging in May 2017.   Vaginal bleeding likely due to radiation changes. We discussed the use of local estrogen however given her prior pulmonary embolism Tammie [facility] is reluctant to expose herself to any chance of increased systemic estrogen.   HPI: Tammie [facility]  is a 55 y.o. last normal menstrual period approximately 2012.. She reports over 2014 she had severe abdominal cramping with intermittent episodes of heavy bleeding. She presented to emergency room on 03/13/2013 with heavy vaginal bleeding. A CT scan of the abdomen and pelvis was notable for a mass expanding the cervix and lower uterine segment measuring 7.9 x 4.7 cm x 3 cm a solid right adnexal mass measuring 5.5 cm in size was also noted this was thought to reflect a pedunculated fibroid. There is a single enhancing left external iliac lymph node measuring 14 mm in the short axis. Patient was taken to the operating room on 03/17/2013 at which time there was a biopsy performed of the cervical mass and an aborted hysteroscopy D&C. Pathology was consistent with squamous mucosa with high-grade squamous dysplasia suspicious for invasive squamous cell carcinoma.  [facility] history is notable for a long history of abnormal Pap tests and previous treatments with cryotherapy and cold knife cone biopsy. The last cervical excision was in 2009. She states that her last abnormal Pap test was in 2012 and wnl.  A biopsy was collected in 03/17/2013 at  [facility] indicated at least severe dysplasia squamous cell carcinoma in situ. . On the presenting  physical examination there was a 7.5 cm cervix with extension to bilateral parametrial. The cervix appears barrel shaped and several additional biopsies were collected that confirmed  invasive squamous cell cancer.   The CT scan demonstrates possible left iliac lymph node involvement. A PET confirmed this suspicion.   [place]  received her external beam radiation therapy at the [facility] in [place] received intracavitary brachytherapy treatments alone in [place]. January 15, January 21, January 26, February 4, June 07, 2013   She received 4 cycles taxol/carbo completed 10/03/2013  PET 11/14/2013 without evidence of hypermetabolic changes.  Benign right  adnexal process appreciated 3.8x4.7cm  Pap 11/23/2014 unsatisfactory.  Pap 01/2015 NEGATIVE FOR INTRAEPITHELIAL LESIONS OR MALIGNANCY. BENIGN REACTIVE CHANGES ASSOCIATED WITH RADIATION EFFECT.  Reports self limiting episode of vaginal bleeding 2 weeks ago associated with the use of a dilator.  No hematochezia, no hematuria, no nausea or emesis, no abdominal pain.   Social Hx:   Social History   Social History  . Marital Status: Single    Spouse Name: N/A  . Number of Children: N/A  . Years of Education: N/A   Occupational History  . Not on file.   Social History Main Topics  . Smoking status: Never Smoker   . Smokeless tobacco: Never Used  . Alcohol Use: No  . Drug Use: No  . Sexual  Activity: No   Other Topics Concern  . Not on file   Social History Narrative    Past Surgical Hx:  Past Surgical History  Procedure Laterality Date  . Cervical biopsy  03/17/2013  . Cervical conization w/bx  2009  . Tandem ring insertion N/A 05/11/2013    Procedure: EXAMINATION UNDER ANETHESIA, PLACEMENT OF GOLD MARKERS, PLACEMENT OF TANDEM RING FOR HIGH DOSE RADIATION THERAPY;  Surgeon: Blair Promise, MD;  Location: [facility];  Service: Urology;  Laterality: N/A;  . Tandem ring insertion N/A 05/17/2013    Procedure: TANDEM RING PLACEMENT;  Surgeon: Blair Promise, MD;  Location: [facility];  Service: Urology;  Laterality: N/A;  . Tandem ring insertion N/A 05/22/2013    Procedure: TANDEM RING PLACEMENT;  Surgeon: Blair Promise, MD;  Location: [facility];  Service: Urology;  Laterality: N/A;  . Tandem ring insertion N/A 05/31/2013    Procedure: TANDEM RING INSERTION;  Surgeon: Blair Promise, MD;  Location: [facility];  Service: Urology;  Laterality: N/A;  . Tandem ring insertion N/A 06/07/2013    Procedure: TANDEM RING INSERTION;  Surgeon: Blair Promise, MD;  Location: [facility];  Service: Urology;  Laterality: N/A;    Past Medical Hx:  Past Medical History  Diagnosis Date  . Factor V Leiden mutation ([place])   . Anxiety   . Hypothyroidism   . ASCUS on Pap smear   . Ovarian cyst   . History of pulmonary embolism   . Cervical cancer ([place]) dx 04-06-2013  [facility]/  [facility] DR Sondra Come FOR BRACHYTHERAPY (HIGH DOSE RADIATION VIA TANDEM RING)    STAGE IIB   T1b2, N1,  M0  invasive differentiated squamous cell carcinoma, FIGO  . MRSA (methicillin resistant staph aureus) culture positive     LEFT BUTTOCK AREA --  USING NEOSPORIN AND TAKING  ORAL ANTIBIOTICS  . S/P PICC central line placement   . Fatigue     SECONDARY TO CHEMORADIATION  . Complication of anesthesia     pt has better pain control w dilaudid than morphine  . Anemia   . History of radiation therapy 05/11/13, 05/17/13, 05/22/13, 05/31/13, 06/07/13    27.5 gray to cervix   Mammogram is scheduled for April 2017  Past Gynecological History: Gravida 0 menarche at 56 with regular menses.  Long standing history of abnormal Pap test with a report of several episodes of cryotherapy. Reports cold knife cone  biopsy in 2009.    Family Hx:  Family History  Problem Relation Age of Onset  . Stroke Mother     Review of Systems: Constitutional  Feels well, Cardiovascular  No chest pain, shortness of breath,  Pulmonary  No cough or wheeze.  Gastro Intestinal  No nausea, vomitting, or diarrhoea. No bright red blood per rectum, no abdominal pain, change in bowel movement, or constipation.  Genito Urinary  No frequency, urgency, dysuria, episode of  vaginal spotting two weeks ago. Musculo Skeletal  No myalgia, arthralgia, joint swelling or pain occasional lower extremity edema extending to the mid calf Neurologic  No weakness,report mild   no change in gait, neuropathy of the lower extremities extending to the mid calf. Improvements over the last few months. No neuropathies of upper extremity Psychology  Long standing depression   Vitals:  Blood pressure 132/63, pulse 63, temperature 98 F (36.7 C), temperature source Oral, resp. rate 18, height  5\' 5"  (1.651 m), weight 144 lb (65.318 kg), SpO2 100 %.  Wt Readings from Last 3 Encounters:  07/25/15 144 lb (65.318 kg)  02/07/15 142 lb 4.8 oz (64.547 kg)  11/23/14 140 lb 11.2 oz (63.821 kg)  Body mass index is 23.96 kg/(m^2).   Physical Exam: WD in NAD Neck  Supple NROM, without any enlargements.  Lymph Node Survey No cervical supraclavicular or inguinal adenopathy Cardiovascular  Pulse normal rate, regularity and rhythm. Lungs  Clear to auscultation bilateraly, Good air movement.  Psychiatry  Alert and oriented to person, flat affect, slow response, appropriate judgement, slow speech, slow reasoning Abdomen  Normoactive bowel sounds, abdomen soft, non-tender. Back: No CVA tenderness Genito Urinary  Vulva/vagina: Normal external female genitalia.  No lesions. Extremely atrophic vagina with radiation changes. No discharge or bleeding.  Bladder/urethra:  No lesions or masses   Uterus mobile, cervix without lesions, flush with the  vaginal vault. , no palpable fibrosis, telangiectasias c/w radiotherapy no parametrial nodularity, no cul-de-sac masses, uncomfortable examination for [facility].  Adnexa: No palpable masses. Rectal  Good tone, no masses fibrotic changes involving the parametria Extremities  1+ BLE edema venous stasis changes extending to the mid calf

## 2015-07-29 ENCOUNTER — Ambulatory Visit: Payer: Managed Care, Other (non HMO) | Admitting: Gynecologic Oncology

## 2015-08-01 ENCOUNTER — Telehealth: Payer: Self-pay | Admitting: Gynecologic Oncology

## 2015-08-01 NOTE — Telephone Encounter (Signed)
Per conversation with Franki Cabot, Administrative assistant this am: Patient thinks she has a UTI but doesn't want to take antibiotics because she has had c diff so many times.  She looked up info on the internet and "it told her what to do."  She drank a moderate amount of water and 2 days later the symptoms returned.  She is having dysuria and frequency.  She wants to know if her CT scan should be moved up since she has a UTI.  Kim spoke to her and said it seemed like she really wanted to scan moved up.  Message sent to Dr. [name] at [facility] with the above information.  Per Dr. [name]: Move up her scan. If it's negative refer her to urology for management of cystitis.   Called and spoke with Dr. Jaclyn Shaggy RN at 2:50pm.  Informed of Dr. Leone Brand recommendations.  Stating she didn't know if Dr. Bobby Rumpf would move up the scan just due to urinary symptoms but she would check.  15:03pm Called the patient and informed her of Dr. Leone Brand recommendations.  Advised she should be hearing from Dr. Jaclyn Shaggy office and if not to call our office.  No concerns voiced.  Advised to call for any needs.

## 2015-08-15 ENCOUNTER — Ambulatory Visit: Payer: Managed Care, Other (non HMO) | Admitting: Gynecologic Oncology

## 2015-08-19 ENCOUNTER — Telehealth: Payer: Self-pay

## 2015-08-19 NOTE — Telephone Encounter (Signed)
Orders received from [place] to contact the patient to update with recommendations re: having "trouble with dilator , it hurts" . Patient should continue to use the dilator , instruct the patient to be sure she using enough lubricant or may change to small dilator . Attempted to contact the patient , no answer . Left a detailed message with call back number provided if additional questions or concerns arise.

## 2015-09-05 DIAGNOSIS — Z8541 Personal history of malignant neoplasm of cervix uteri: Secondary | ICD-10-CM | POA: Diagnosis not present

## 2015-12-26 ENCOUNTER — Telehealth: Payer: Self-pay | Admitting: *Deleted

## 2015-12-26 ENCOUNTER — Ambulatory Visit: Payer: Managed Care, Other (non HMO) | Admitting: Gynecologic Oncology

## 2015-12-26 NOTE — Telephone Encounter (Signed)
Pt called  requesting to reschedule her appointment . Pt states "I am not feeling well ,my neuropathy is acting up." Pt was rescheduled until 01/23/16 with Dr. [name]

## 2016-01-23 ENCOUNTER — Ambulatory Visit: Payer: Managed Care, Other (non HMO) | Admitting: Gynecologic Oncology

## 2016-01-31 ENCOUNTER — Telehealth: Payer: Self-pay

## 2016-01-31 NOTE — Telephone Encounter (Addendum)
Incoming call from a patient of Dr Janie Morning requesting Dr [name] be updated that she was started on Cymbalta for "nerve " damage and "depression" by Dr Theda Sers. Patient also wanted Dr Janie Morning to know that she is still using the vaginal dilator as directed and that it is still a little "uncomfortable" and she has a little spotting the day after. Writer informed the patient that a little discomfort and spotting are not unusual . Patient also informed that Dr [name] would be updated as requested. Patient stated understanding and thanked the Probation officer.

## 2016-02-11 NOTE — Telephone Encounter (Signed)
Late entry: Patient called to update Dr Janie Morning that her "cymbalta" has been increased from 60 mg to 90 mg daily per her "physiatrist" . Patient informed that Dr [name] will be updated as requested. Patient denies further questions at this time.

## 2016-02-14 ENCOUNTER — Ambulatory Visit: Payer: Managed Care, Other (non HMO) | Admitting: Gynecology

## 2016-02-14 ENCOUNTER — Telehealth: Payer: Self-pay | Admitting: *Deleted

## 2016-02-14 NOTE — Telephone Encounter (Signed)
Pt left message stating she wants to cancel her appointment because she has had diarrhea and she will call back later to reschedule

## 2016-03-13 ENCOUNTER — Ambulatory Visit: Payer: Managed Care, Other (non HMO) | Admitting: Gynecology

## 2016-03-13 ENCOUNTER — Telehealth: Payer: Self-pay

## 2016-03-13 NOTE — Telephone Encounter (Signed)
Incoming call ,patient cancelled her appointment today at 2:45 with Dr Marti Sleigh . Patient states she feeling "depressed" and cant get out of the house. Stated "thank you" and ended the call, Melissa Cross, APNP updated.

## 2016-03-16 ENCOUNTER — Telehealth: Payer: Self-pay | Admitting: Gynecologic Oncology

## 2016-03-16 NOTE — Telephone Encounter (Signed)
Message left by patient stating "I know I cancelled another appointment.  I am in a dark hole and I don't know what is wrong with me.  I go to work, then go home, and get in the bed, then do the same thing the next day.  I had cancelled by appointment with Dr. Bobby Rumpf but have rescheduled it for this Wed and I am going.  If I could be seen on Wednesday that would be great.  Or Monday, Tues, or Friday.  Also, I was wondering if Dr. [name] could see my 9 mths after she saw me last and give me a pap smear since I had cervical cancer."

## 2016-03-16 NOTE — Telephone Encounter (Signed)
Called pt to check on her status and discuss available appointments. Unable to reach pt, recording " voice mail is full"

## 2016-03-18 ENCOUNTER — Telehealth: Payer: Self-pay | Admitting: *Deleted

## 2016-03-18 NOTE — Telephone Encounter (Signed)
Pt called to request a f/u appt with Dr. [name] " to see if I have cancer again. I need a Pap Smear" Discussed with pt Dr. [name] is not available in the month of December. Pt denied any symptoms- pain bleeding, discharge. Pt again asked to be seen for a pap smear to "determine if the cancer is back". Pt scheduled for 12/7 at 2pm with Dr. [name].  Pt re verbalized the appt date and time and confirmed she will be here. Discussed with pt she has missed or cancelled approximately 8-9 appts since march.  Pt again states " I'll be there"

## 2016-04-01 NOTE — Progress Notes (Signed)
GYNECOLOGIC ONCOLOGY RETURN VISIT  04/02/16  REASON FOR VISIT: Follow up  ASSESSMENT AND PLAN: Tammie Marsh is a 55 y.o. 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  She is NED on exam today. She continues to use her vaginal dilators and has significant pelvic pain. She is unable to be sexually active. I think she would benefit from pelvic PT in addition to dilator use given significant right sided levator tenderness on exam. She is encouraged to continue with her dilators as her vaginal caliber is enough to allow 2 digits on exam. Referral to pelvic PT near [place] will be made. 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 She appears to have a component of LE venous stasis versus lymphedema on exam. We will refer her to a specialist for help with these symptoms.  Gillian Scarce, MD   HPI: Tammie Marsh is a 55 y.o. 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  Tammie Marsh presented to emergency room on 03/13/2013 with heavy vaginal bleeding. A CT scan of the abdomen and pelvis was notable for a mass expanding the cervix and lower uterine segment measuring 7.9 x 4.7 cm x 3 cm a solid right adnexal mass measuring 5.5 cm in size was also noted this was thought to reflect a pedunculated fibroid. There is a single enhancing left external iliac lymph node measuring 14 mm in the short axis. Patient was taken to the operating room on 03/17/2013 at which time there was a biopsy performed of the cervical mass and an aborted hysteroscopy D&C. Pathology was consistent with squamous mucosa with high-grade squamous dysplasia suspicious for invasive squamous cell carcinoma.  On the presenting  physical examination there was a 7.5 cm cervix with extension to bilateral parametria.The CT scan demonstrates possible left iliac lymph node involvement. A PET confirmed this suspicion. The patient then received her external beam radiation  therapy at the [facility] in [place] and intracavitary brachytherapy treatments in [place]. (completed June 07, 2013).   After primary chemoradiation, she received 4 cycles taxol/carbo completed 10/03/2013. PET 11/14/2013 without evidence of hypermetabolic changes.  Benign right  adnexal process appreciated 3.8x4.7cm  Pap 11/23/2014 unsatisfactory. Pap 01/2015: NIL  INTERVAL HISTORY: She has been doing fine since her last visit. She has some bleeding with vaginal dilator use but otherwise vaginal bleeding. No changes in bowel or bladder function. No chest pain or SOB. 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 It is symmetric.  PAST MEDICAL/SURGICAL HX: Past Medical History:  Diagnosis Date  . Anemia   . Anxiety   . ASCUS on Pap smear   . Cervical cancer ([place]) dx 04-06-2013  [facility]/  [facility] DR Sondra Come FOR BRACHYTHERAPY (HIGH DOSE RADIATION VIA TANDEM RING)   STAGE IIB   T1b2, N1,  M0  invasive differentiated squamous cell carcinoma, FIGO  . Complication of anesthesia    pt has better pain control w dilaudid than morphine  . Factor V Leiden mutation ([place])   . Fatigue    SECONDARY TO CHEMORADIATION  . History of pulmonary embolism   . History of radiation therapy 05/11/13, 05/17/13, 05/22/13, 05/31/13, 06/07/13   27.5 gray to cervix  . Hypothyroidism   . MRSA (methicillin resistant staph aureus) culture positive    LEFT BUTTOCK AREA --  USING NEOSPORIN AND TAKING  ORAL ANTIBIOTICS  . Ovarian  cyst   . S/P PICC central line placement     Past Surgical History:  Procedure Laterality Date  . CERVICAL BIOPSY  03/17/2013  . CERVICAL CONIZATION W/BX  2009  . TANDEM RING INSERTION N/A 05/11/2013   Procedure: EXAMINATION UNDER ANETHESIA, PLACEMENT OF GOLD MARKERS, PLACEMENT OF TANDEM RING FOR HIGH DOSE RADIATION THERAPY;  Surgeon: Blair Promise, MD;  Location: [facility];  Service: Urology;  Laterality: N/A;  . TANDEM RING INSERTION N/A 05/17/2013   Procedure: TANDEM RING PLACEMENT;  Surgeon: Blair Promise, MD;  Location: [facility];  Service: Urology;  Laterality: N/A;  . TANDEM RING INSERTION N/A 05/22/2013   Procedure: TANDEM RING PLACEMENT;  Surgeon: Blair Promise, MD;  Location: [facility];  Service: Urology;  Laterality: N/A;  . TANDEM RING INSERTION N/A 05/31/2013   Procedure: TANDEM RING INSERTION;  Surgeon: Blair Promise, MD;  Location: [facility];  Service: Urology;  Laterality: N/A;  . TANDEM RING INSERTION N/A 06/07/2013   Procedure: TANDEM RING INSERTION;  Surgeon: Blair Promise, MD;  Location: [facility];  Service: Urology;  Laterality: N/A;    Family History  Problem Relation Age of Onset  . Stroke Mother     Social History   Social History  . Marital status: Single    Spouse name: N/A  . Number of children: N/A  . Years of education: N/A   Social History Main Topics  . Smoking status: Never Smoker  . Smokeless tobacco: Never Used  . Alcohol use No  . Drug use: No  . Sexual activity: No   Other Topics Concern  . None   Social History Narrative  . None    CURRENT MEDICATIONS:  Current Outpatient Prescriptions:  .  DULoxetine (CYMBALTA) 30 MG capsule, Take 30 mg by mouth., Disp: , Rfl:  .  DULoxetine (CYMBALTA) 60 MG capsule, Take 60 mg by mouth., Disp: , Rfl:  .  pregabalin (LYRICA) 50 MG capsule, Take 50 mg by mouth., Disp: , Rfl:  .  Ascorbic Acid (VITAMIN C) 1000 MG tablet, Take 1,000 mg by mouth., Disp: , Rfl:  .  Biotin 10 MG TABS, Take 10 mg by mouth daily. , Disp: , Rfl:  .  cholecalciferol (VITAMIN D) 1000 UNITS tablet, Take 1,000 Units by mouth daily., Disp: , Rfl:  .  clonazePAM (KLONOPIN) 2 MG tablet, Take 2 mg by mouth 2 (two) times daily. , Disp: , Rfl:  .  cyanocobalamin 1000 MCG tablet, Take 100 mcg by mouth daily., Disp: , Rfl:   .  gabapentin (NEURONTIN) 300 MG capsule, Take 900 mg by mouth 3 (three) times daily. , Disp: , Rfl:  .  levothyroxine (SYNTHROID, LEVOTHROID) 100 MCG tablet, take 1 tablet by mouth once daily ON AN EMPTY STOMACH, Disp: , Rfl: 0 .  oxybutynin (DITROPAN) 5 MG tablet, Take 5 mg by mouth., Disp: , Rfl:  .  PARoxetine (PAXIL) 30 MG tablet, Take 60 mg by mouth 2 (two) times daily at 10 AM and 5 PM. , Disp: , Rfl:  .  triamterene-hydrochlorothiazide (MAXZIDE-25) 37.5-25 MG tablet, , Disp: , Rfl:  .  vitamin E 400 UNIT capsule, Take 400 Units by mouth daily., Disp: , Rfl:   REVIEW OF SYSTEMS: Complete 10-system review is negative except as above in Interval History.  PHYSICAL EXAM: BP 136/69 (BP Location: Left Arm, Patient Position: Sitting)   Pulse 69   Temp 98.6 F (  37 C) (Oral)   Resp 18   Wt 141 lb 8 oz (64.2 kg)   SpO2 100%   BMI 23.55 kg/m  General: Alert, oriented, no acute distress. HEENT: Posterior oropharynx clear, sclera anicteric. Chest: Clear to auscultation bilaterally.  . Cardiovascular: Regular rate and rhythm, no murmurs. Abdomen: Soft, nontender.  Normoactive bowel sounds.  No masses or hepatosplenomegaly appreciated.   Extremities: Grossly normal range of motion.  Warm, well perfused.  Trace edema bilaterally with chronic stasis changes to the bilateral ankles. Skin: No rashes or lesions noted. Lymphatics: No cervical, supraclavicular, or inguinal adenopathy. GU: Normal appearing external genitalia without erythema, excoriation, or lesions.  Speculum exam reveals atrophic vagina with telangectasias consistent with radiation.  Bimanual exam reveals no palpable mass or parametrial nodularity.  Rectovaginal exam confirms the above findings.  Review of the patient's chart and labs were performed in addition to obtaining the above history from the patient at the visit today.  Overall >50% of this 25 minute visit was spent in direct counseling and coordination of care.

## 2016-04-02 ENCOUNTER — Encounter: Payer: Self-pay | Admitting: Gynecologic Oncology

## 2016-04-02 ENCOUNTER — Other Ambulatory Visit (HOSPITAL_COMMUNITY)
Admission: RE | Admit: 2016-04-02 | Discharge: 2016-04-02 | Disposition: A | Discharge status: Home / Self Care | Payer: Managed Care, Other (non HMO) | Source: Ambulatory Visit | Attending: Gynecologic Oncology | Admitting: Gynecologic Oncology

## 2016-04-02 ENCOUNTER — Ambulatory Visit: Payer: Managed Care, Other (non HMO) | Attending: Gynecologic Oncology | Admitting: Gynecologic Oncology

## 2016-04-02 VITALS — BP 136/69 | HR 69 | Temp 98.6°F | Resp 18 | Wt 141.5 lb

## 2016-04-02 DIAGNOSIS — Z79899 Other long term (current) drug therapy: Secondary | ICD-10-CM | POA: Insufficient documentation

## 2016-04-02 DIAGNOSIS — Z09 Encounter for follow-up examination after completed treatment for conditions other than malignant neoplasm: Secondary | ICD-10-CM | POA: Insufficient documentation

## 2016-04-02 DIAGNOSIS — Z01411 Encounter for gynecological examination (general) (routine) with abnormal findings: Secondary | ICD-10-CM | POA: Diagnosis not present

## 2016-04-02 DIAGNOSIS — R102 Pelvic and perineal pain: Secondary | ICD-10-CM | POA: Diagnosis not present

## 2016-04-02 DIAGNOSIS — G620 Drug-induced polyneuropathy: Secondary | ICD-10-CM | POA: Diagnosis not present

## 2016-04-02 DIAGNOSIS — Z8541 Personal history of malignant neoplasm of cervix uteri: Secondary | ICD-10-CM | POA: Insufficient documentation

## 2016-04-02 DIAGNOSIS — C538 Malignant neoplasm of overlapping sites of cervix uteri: Secondary | ICD-10-CM | POA: Diagnosis not present

## 2016-04-02 DIAGNOSIS — T451X5A Adverse effect of antineoplastic and immunosuppressive drugs, initial encounter: Secondary | ICD-10-CM

## 2016-04-02 NOTE — Patient Instructions (Signed)
Our office will contact you within the next 2 business day with the name of a lymphadema specialist. You will be contacted with your pap results next week.

## 2016-04-07 ENCOUNTER — Telehealth: Payer: Self-pay | Admitting: Nurse Practitioner

## 2016-04-07 LAB — CYTOLOGY - PAP: Diagnosis: NEGATIVE

## 2016-04-07 NOTE — Telephone Encounter (Signed)
Per Lenna Sciara, NP, rn called patient to inform her of normal PAP result. She verbalizes understanding. Patient states that she is confused about how often she needs to follow up and is unclear about her risk of recurrence. Does not say definitively in last MD note. RN will follow up. Patient is anxious about cancer recurrence and notes that she is very sorry about missing previous appointments. She states she has very little energy and can hardly do anything other than work and sleep. Rn provided emotional support as she has been through multiple treatments and suggested she report this to PCP. Patient also to see Dr. Bobby Rumpf this Friday for blood work. Will convey patient's concerns to MD for clear plan for f/u.

## 2016-04-07 NOTE — Telephone Encounter (Signed)
Patient called stating at her last apt she was going to be referred for pelvic PT and to a lymphedema specialist. She has not received a call or any further details. I told her I would find out more information and would have to call her back, at which point she became very upset.   I called [facility] and spoke to Tammy with Dr. Bobby Rumpf. Dr. Bobby Rumpf is out today but Lynelle Smoke will find out where he wants her referred and she will call me back.   I called patient back to reassure her that we are working on making these apts for her but it may take another day or 2 before we have details. She verbalizes understanding. I will call back with apts after hearing from Tammy.

## 2016-04-09 ENCOUNTER — Telehealth: Payer: Self-pay | Admitting: Gynecologic Oncology

## 2016-04-09 NOTE — Telephone Encounter (Signed)
I called patient with results. No answer. VM left that pap normal. Information left for PT in [place].  Donaciano Eva, PT [facility] [address] [place], [place] [address] [facility]: [phone], [place]: [phone]

## 2016-04-10 ENCOUNTER — Telehealth: Payer: Self-pay | Admitting: *Deleted

## 2016-04-10 DIAGNOSIS — Z8541 Personal history of malignant neoplasm of cervix uteri: Secondary | ICD-10-CM | POA: Diagnosis not present

## 2016-04-10 NOTE — Telephone Encounter (Signed)
Called pt regarding referrals. Pt verbalized she received message and phone #s from Dr. [name]. Pt advised she will call PT again this morning. No further concerns.

## 2016-04-28 ENCOUNTER — Telehealth: Payer: Self-pay | Admitting: Gynecologic Oncology

## 2016-04-28 NOTE — Telephone Encounter (Signed)
Patient called with complaints of right vulvar/vagina discomfort and she is not sure if she pulled a muscle etc.  She reports having difficulty getting out of the car due to discomfort after a long ride.  No lesions or vaginal bleeding reported.  Offered patient an appt to come in this Friday or Monday.  Stating she would want to come in on Tuesday but advised the physicians are in surgery on Tues.  Advised her to try mild heat to the area and take ibuprofen as needed if she can tolerate that and to call in one to two days if no improvement because we would really want to get her in the office to be seen.  Verbalizing understanding.  Advised to call for any needs or concerns in between that time.

## 2016-05-12 ENCOUNTER — Telehealth: Payer: Self-pay

## 2016-05-12 NOTE — Telephone Encounter (Signed)
Pt called with complaints of right vulvar/vaginal discomfort, and stated haivng pain in her right groin area after standing for a period of time or if squating.  Pt denied and bleeding, difficulty with urination or fever associated. She had called on 04/28/16 pertaining to this issue and she has not had improvement. RN discussed issue with M. Cross,NP and advised pt to schedule an appointment. Pt was given next available appointment for 05/19/16 at 3:30. Pt advised to call back if symptoms worsen. Pt verbalized understanding.

## 2016-05-15 ENCOUNTER — Telehealth: Payer: Self-pay

## 2016-05-15 NOTE — Telephone Encounter (Signed)
Pt called to cancel 05/19/16 appointment with Dr [name]. Pt chose to go to walk in clinic on 2/16 due to pain. She called to report that the ER MD diagnosed her with pulled muscle. This has been the source of the groin pain that she had complained of.

## 2016-05-19 ENCOUNTER — Ambulatory Visit: Payer: Managed Care, Other (non HMO) | Admitting: Gynecologic Oncology

## 2016-05-22 ENCOUNTER — Telehealth: Payer: Self-pay | Admitting: Nurse Practitioner

## 2016-05-22 NOTE — Telephone Encounter (Signed)
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 She has been reading on the internet and wants clarification. She is unsure of the details of her specific diagnosis and if her cervical cancer was related to HPV. I explained that we will follow her closely with physical exams and PAP smears, and will further examine any abnormalities that may be found. I offered to make her an appointment with MD to discuss the details of her case in more detail, but she stated she would wait for her 93-month f/u apt in approx June. She does not have her schedule handy so she will call back at a later date to make the apt. She is encouraged to call with any questions or concerns in the meantime and she verbalizes understanding.

## 2016-09-04 ENCOUNTER — Telehealth: Payer: Self-pay | Admitting: Gynecologic Oncology

## 2016-09-04 NOTE — Telephone Encounter (Signed)
Returned call to patient and unable to leave a message since mailbox was full.  She had called the office earlier stating she doesn't want to come in unless she is having pap smear.

## 2016-09-07 ENCOUNTER — Telehealth: Payer: Self-pay | Admitting: *Deleted

## 2016-09-07 NOTE — Telephone Encounter (Signed)
I have returned the patient's call and scheduled an appt for June 7th at 12:30pm. Patient aware of date/time

## 2016-09-16 ENCOUNTER — Telehealth: Payer: Self-pay | Admitting: *Deleted

## 2016-09-16 NOTE — Telephone Encounter (Signed)
Appt for follow up scheduled per NP with Dr. Denman George. Per conversation with the patient was able to leave message on her machine with the date/time of her appt June 25th at 3:30pm.

## 2016-09-16 NOTE — Telephone Encounter (Signed)
Patient called to reschedule her appt with Dr. [name]. Patient requested to see Dr. [name] again, patient informed the patient that She is no longer here. The patient stated that she has " seen another female doctor here." patient informed that this information will be discussed with the NP and the patient will be called back.

## 2016-09-16 NOTE — Telephone Encounter (Signed)
Patient stated in her call "I need to cancel my appt for June 7th, I have to work. Patient given the next option of  July 5th to see Dr. [name]. Patient stated that "I have a problem with surgeons, I have seen another doctor there. But I don't want to see the female doctor."

## 2016-10-01 ENCOUNTER — Ambulatory Visit: Payer: Managed Care, Other (non HMO) | Admitting: Gynecologic Oncology

## 2016-10-06 DIAGNOSIS — Z923 Personal history of irradiation: Secondary | ICD-10-CM

## 2016-10-06 DIAGNOSIS — Z9221 Personal history of antineoplastic chemotherapy: Secondary | ICD-10-CM | POA: Diagnosis not present

## 2016-10-06 DIAGNOSIS — Z8541 Personal history of malignant neoplasm of cervix uteri: Secondary | ICD-10-CM | POA: Diagnosis not present

## 2016-10-19 ENCOUNTER — Telehealth: Payer: Self-pay | Admitting: *Deleted

## 2016-10-19 ENCOUNTER — Ambulatory Visit: Payer: Managed Care, Other (non HMO) | Admitting: Gynecologic Oncology

## 2016-10-19 NOTE — Telephone Encounter (Signed)
Patient called the office and left a message to cancel her appt today due to being sick. Patient stated that she will call back this week to reschedule.

## 2016-10-29 ENCOUNTER — Ambulatory Visit: Payer: Managed Care, Other (non HMO) | Admitting: Gynecologic Oncology

## 2017-11-01 ENCOUNTER — Telehealth: Payer: Self-pay | Admitting: *Deleted

## 2017-11-01 NOTE — Telephone Encounter (Signed)
Patient called in requesting appointment for a pap smear. Patient states " I need the appointment on August 2nd because I have taken that day off for vacation".  I told the patient that our schedule is full for that day.  Offered patient other times, patient states " that will not work because I work Monday -Friday and I don't get off until 5:30pm.  I told patient that our office will call her if we have cancellations on August 2nd.  Patient verbalized understanding.

## 2017-11-03 ENCOUNTER — Telehealth: Payer: Self-pay | Admitting: *Deleted

## 2017-11-03 NOTE — Telephone Encounter (Signed)
Patient called and scheduled her follow up appt. Patient aware appt is July 29th at 2:45pm, arrive at 2:30pm see Dr. Gerarda Fraction

## 2017-11-04 ENCOUNTER — Telehealth: Payer: Self-pay | Admitting: *Deleted

## 2017-11-04 NOTE — Telephone Encounter (Signed)
Patient called in 7/10 requesting a call back from you regarding her appointment on 7/29.  IB message was sent to [facility] 7/11

## 2017-11-10 ENCOUNTER — Telehealth: Payer: Self-pay

## 2017-11-10 NOTE — Telephone Encounter (Signed)
Incoming call from patient requesting an appt on august 1st or 2nd instead of 7-29.  Nothing available on those dates- pt voiced understanding.  Was able schedule her for August 9th at 3 pm with Dr Gerarda Fraction. Pt aggreeble. No other needs per pt at this time.

## 2017-11-15 ENCOUNTER — Telehealth: Payer: Self-pay | Admitting: *Deleted

## 2017-11-15 NOTE — Telephone Encounter (Signed)
Returned the patient's phone regarding rescheduling an appt. Patient requesting an appt on either August 2nd or August 19th. Called and left the patient a messsge with the possible appt on August 19th at 9:30am. Ask that she calls back if she wants the appt.

## 2017-11-22 ENCOUNTER — Ambulatory Visit: Payer: Managed Care, Other (non HMO) | Admitting: Obstetrics

## 2017-12-02 ENCOUNTER — Telehealth: Payer: Self-pay

## 2017-12-02 NOTE — Telephone Encounter (Signed)
LM for Tammie Marsh that her appointment is R/S to  12-13-17 at 0930 from 12-03-17.  She was to call our office back and let us know if she wanted the 12-13-17 appointment.  Fortunately it is still available. She needs to call our office back if this appointment now does not work with her schedule.

## 2017-12-03 ENCOUNTER — Inpatient Hospital Stay: Payer: Managed Care, Other (non HMO) | Admitting: Obstetrics

## 2017-12-12 NOTE — Progress Notes (Deleted)
GYNECOLOGIC ONCOLOGY RETURN VISIT  04/02/16  REASON FOR VISIT: Follow up  ASSESSMENT AND PLAN: Tammie Marsh is a 57 y.o. 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  1. She is NED on exam today.  2. Surveillance plan  Annual Pap  Biannual pelvic 3. She continues to use her vaginal dilators and has significant pelvic pain. She is unable to be sexually active. She is encouraged to continue with her dilators as her vaginal caliber is enough to allow 2 digits on exam.   Referral to pelvic PT near [place] will be made.  4. significant neuropathy despite neurontin and B6. She appears to have a component of LE venous stasis versus lymphedema on exam. We will refer her to a specialist for help with these symptoms.  Gillian Scarce, MD   HPI: Tammie Marsh is a 57 y.o. 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  [name] presented to emergency room on 03/13/2013 with heavy vaginal bleeding. A CT scan of the abdomen and pelvis was notable for a mass expanding the cervix and lower uterine segment measuring 7.9 x 4.7 cm x 3 cm a solid right adnexal mass measuring 5.5 cm in size was also noted this was thought to reflect a pedunculated fibroid. There is a single enhancing left external iliac lymph node measuring 14 mm in the short axis. Patient was taken to the operating room on 03/17/2013 at which time there was a biopsy performed of the cervical mass and an aborted hysteroscopy D&C. Pathology was consistent with squamous mucosa with high-grade squamous dysplasia suspicious for invasive squamous cell carcinoma.  On the presenting  physical examination there was a 7.5 cm cervix with extension to bilateral parametria.The CT scan demonstrates possible left iliac lymph node involvement. A PET confirmed this suspicion. The patient then received her external beam radiation therapy at the [facility] in [place] and intracavitary  brachytherapy treatments in [place]. (completed June 07, 2013).   After primary chemoradiation, she received 4 cycles taxol/carbo completed 10/03/2013. PET 11/14/2013 without evidence of hypermetabolic changes.  Benign right  adnexal process appreciated 3.8x4.7cm  Pap 11/23/2014 unsatisfactory. Pap 01/2015: NIL  INTERVAL HISTORY: Last seen Dec 2017 ***    PAST MEDICAL/SURGICAL HX: Past Medical History:  Diagnosis Date  . Anemia   . Anxiety   . ASCUS on Pap smear   . 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 Complication of anesthesia    pt has better pain control w dilaudid than morphine  . Factor V Leiden mutation ([place])   . Fatigue    SECONDARY TO CHEMORADIATION  . History of pulmonary embolism   . History of radiation therapy 05/11/13, 05/17/13, 05/22/13, 05/31/13, 06/07/13   27.5 gray to cervix  . Hypothyroidism   . MRSA (methicillin resistant staph aureus) culture positive    LEFT BUTTOCK AREA --  USING NEOSPORIN AND TAKING  ORAL ANTIBIOTICS  . Ovarian cyst   . S/P PICC central line placement     Past Surgical History:  Procedure Laterality Date  . CERVICAL BIOPSY  03/17/2013  . CERVICAL CONIZATION W/BX  2009  . TANDEM RING INSERTION N/A 05/11/2013   Procedure: EXAMINATION UNDER ANETHESIA, PLACEMENT OF GOLD MARKERS, PLACEMENT OF  TANDEM RING FOR HIGH DOSE RADIATION THERAPY;  Surgeon: Blair Promise, MD;  Location: [facility];  Service: Urology;  Laterality: N/A;  . TANDEM RING INSERTION N/A 05/17/2013   Procedure: TANDEM RING PLACEMENT;  Surgeon: Blair Promise, MD;  Location: [facility];  Service: Urology;  Laterality: N/A;  . TANDEM RING INSERTION N/A 05/22/2013   Procedure: TANDEM RING PLACEMENT;  Surgeon: Blair Promise, MD;   Location: [facility];  Service: Urology;  Laterality: N/A;  . TANDEM RING INSERTION N/A 05/31/2013   Procedure: TANDEM RING INSERTION;  Surgeon: Blair Promise, MD;  Location[facility];  Service: Urology;  Laterality: N/A;  . TANDEM RING INSERTION N/A 06/07/2013   Procedure: TANDEM RING INSERTION;  Surgeon: Blair Promise, MD;  Location: [facility];  Service: Urology;  Laterality: N/A;    Family History  Problem Relation Age of Onset  . Stroke Mother     Social History   Socioeconomic History  . Marital status: Single    Spouse name: Not on file  . Number of children: Not on file  . Years of education: Not on file  . Highest education level: Not on file  Occupational History  . Not on file  Social Needs  . Financial resource strain: Not on file  . Food insecurity:    Worry: Not on file    Inability: Not on file  . Transportation needs:    Medical: Not on file    Non-medical: Not on file  Tobacco Use  . Smoking status: Never Smoker  . Smokeless tobacco: Never Used  Substance and Sexual Activity  . Alcohol use: No  . Drug use: No  . Sexual activity: Never  Lifestyle  . Physical activity:    Days per week: Not on file    Minutes per session: Not on file  . Stress: Not on file  Relationships  . Social connections:    Talks on phone: Not on file    Gets together: Not on file    Attends religious service: Not on file    Active member of club or organization: Not on file    Attends meetings of clubs or organizations: Not on file    Relationship status: Not on file  Other Topics Concern  . Not on file  Social History Narrative  . Not on file    CURRENT MEDICATIONS:  Current Outpatient Medications:  .  Ascorbic Acid (VITAMIN C) 1000 MG tablet, Take 1,000 mg by mouth., Disp: , Rfl:  .  Biotin 10 MG TABS, Take 10 mg by mouth daily. , Disp: , Rfl:  .  cholecalciferol (VITAMIN D) 1000 UNITS tablet, Take 1,000 Units by  mouth daily., Disp: , Rfl:  .  clonazePAM (KLONOPIN) 2 MG tablet, Take 2 mg by mouth 2 (two) times daily. , Disp: , Rfl:  .  cyanocobalamin 1000 MCG tablet, Take 100 mcg by mouth daily., Disp: , Rfl:  .  DULoxetine (CYMBALTA) 30 MG capsule, Take 30 mg by mouth., Disp: , Rfl:  .  DULoxetine (CYMBALTA) 60 MG capsule, Take 60 mg by mouth., Disp: , Rfl:  .  gabapentin (NEURONTIN) 300 MG capsule, Take 900 mg by mouth 3 (three) times daily. , Disp: , Rfl:  .  levothyroxine (SYNTHROID, LEVOTHROID) 100 MCG tablet, take 1 tablet by mouth once daily ON AN EMPTY STOMACH, Disp: , Rfl: 0 .  oxybutynin (DITROPAN) 5 MG tablet, Take 5  mg by mouth., Disp: , Rfl:  .  PARoxetine (PAXIL) 30 MG tablet, Take 60 mg by mouth 2 (two) times daily at 10 AM and 5 PM. , Disp: , Rfl:  .  pregabalin (LYRICA) 50 MG capsule, Take 50 mg by mouth., Disp: , Rfl:  .  triamterene-hydrochlorothiazide (MAXZIDE-25) 37.5-25 MG tablet, , Disp: , Rfl:  .  vitamin E 400 UNIT capsule, Take 400 Units by mouth daily., Disp: , Rfl:   REVIEW OF SYSTEMS: Review of Systems - Oncology  PHYSICAL EXAM: There were no vitals taken for this visit.

## 2017-12-13 ENCOUNTER — Telehealth: Payer: Self-pay | Admitting: *Deleted

## 2017-12-13 ENCOUNTER — Ambulatory Visit: Payer: Self-pay | Admitting: Obstetrics

## 2017-12-13 NOTE — Telephone Encounter (Signed)
Patient called and left a message to cancel her appt for today. The patient left a voice mail message. She stated she would call back to reschedule

## 2018-02-04 ENCOUNTER — Telehealth: Payer: Self-pay | Admitting: *Deleted

## 2018-02-04 NOTE — Telephone Encounter (Signed)
Returned the patient's call and scheduled her for an appt on 10/15 at 2:45pm to see Dr. Gerarda Fraction

## 2018-02-08 ENCOUNTER — Ambulatory Visit: Payer: Self-pay | Admitting: Obstetrics

## 2018-02-08 ENCOUNTER — Telehealth: Payer: Self-pay | Admitting: Gynecologic Oncology

## 2018-02-08 NOTE — Telephone Encounter (Signed)
Patient had appt today with Dr. Gerarda Fraction.  She left message stating she had to cancel because she has to go to work. "This new job is ridiculous.  I know I need a pap. I will give you a call back to reschedule."

## 2018-02-08 NOTE — Progress Notes (Deleted)
[place] at [facility]   Consult Note: New Patient FIRST VISIT   Consult was requested by Dr. Marland Kitchen   No chief complaint on file.   ***GYN Oncologic Summary 1. *** o ***  HPI: [name]  is a *** nice 57 y.o.  ***  ***  Last seen 06/2015 by Dr. [name]. Has had multiple visits scheduled and patient has cancelled and rescheduled.   Office Visit:  GYN ONCOLOGY    Tammie Marsh 57 y.o. female  CC:  Cervical cancer stage I B2.     HPI: [name]  is a 57 y.o. last normal menstrual period approximately 2012.. She reports over 2014 she had severe abdominal cramping with intermittent episodes of heavy bleeding. She presented to emergency room on 03/13/2013 with heavy vaginal bleeding. A CT scan of the abdomen and pelvis was notable for a mass expanding the cervix and lower uterine segment measuring 7.9 x 4.7 cm x 3 cm a solid right adnexal mass measuring 5.5 cm in size was also noted this was thought to reflect a pedunculated fibroid. There is a single enhancing left external iliac lymph node measuring 14 mm in the short axis. Patient was taken to the operating room on 03/17/2013 at which time there was a biopsy performed of the cervical mass and an aborted hysteroscopy D&C. Pathology was consistent with squamous mucosa with high-grade squamous dysplasia suspicious for invasive squamous cell carcinoma.  Tammie Marsh history is notable for a long history of abnormal Pap tests and previous treatments with cryotherapy and cold knife cone biopsy. The last cervical excision was in 2009. She states that her last abnormal Pap test was in 2012 and wnl.  A biopsy was collected in 03/17/2013 at [facility] indicated at least severe dysplasia squamous cell carcinoma in situ. . On the presenting  physical examination there was a 7.5 cm cervix with extension to bilateral parametrial. The cervix appears barrel shaped and several  additional biopsies were collected that confirmed  invasive squamous cell cancer.   The CT scan demonstrates possible left iliac lymph node involvement. A PET confirmed this suspicion.   Tammie Marsh  received her external beam radiation therapy at the [facility] in [place].Tammie Marsh received intracavitary brachytherapy treatments alone in [place]. January 15, January 21, January 26, February 4, June 07, 2013   She received 4 cycles taxol/carbo completed 10/03/2013  PET 11/14/2013 without evidence of hypermetabolic changes.  Benign right  adnexal process appreciated 3.8x4.7cm  Pap 11/23/2014 unsatisfactory.  Pap 01/2015 NEGATIVE FOR INTRAEPITHELIAL LESIONS OR MALIGNANCY. BENIGN REACTIVE CHANGES ASSOCIATED WITH RADIATION EFFECT.  Reports self limiting episode of vaginal bleeding 2 weeks ago associated with the use of a dilator.  No hematochezia, no hematuria, no nausea or emesis, no abdominal pain.  Imported EPIC Oncologic History: ***   Cervical cancer ([place])   04/06/2013 Initial Diagnosis    Cervical cancer     Measurement of disease: No results for input(s): CA125, CAN125, CEA, CA199, ESTRADIOL, INHBB in the last 8760 hours.  Invalid input(s): INHIBINA .RESUFAST[CAN125,CEA:4  *** . ***  Radiology: . ***  Outpatient Encounter Medications as of 02/08/2018  Medication Sig  . Ascorbic Acid (VITAMIN C) 1000 MG tablet Take 1,000 mg by mouth.  . Biotin 10 MG TABS Take 10 mg by mouth daily.   . cholecalciferol (VITAMIN D) 1000 UNITS tablet Take 1,000 Units by mouth daily.  . clonazePAM (KLONOPIN) 2 MG  tablet Take 2 mg by mouth 2 (two) times daily.   . cyanocobalamin 1000 MCG tablet Take 100 mcg by mouth daily.  . DULoxetine (CYMBALTA) 30 MG capsule Take 30 mg by mouth.  . DULoxetine (CYMBALTA) 60 MG capsule Take 60 mg by mouth.  . gabapentin (NEURONTIN) 300 MG capsule Take 900 mg by mouth 3 (three) times daily.   Marland Kitchen levothyroxine (SYNTHROID, LEVOTHROID)  100 MCG tablet take 1 tablet by mouth once daily ON AN EMPTY STOMACH  . oxybutynin (DITROPAN) 5 MG tablet Take 5 mg by mouth.  Marland Kitchen PARoxetine (PAXIL) 30 MG tablet Take 60 mg by mouth 2 (two) times daily at 10 AM and 5 PM.   . pregabalin (LYRICA) 50 MG capsule Take 50 mg by mouth.  . triamterene-hydrochlorothiazide (MAXZIDE-25) 37.5-25 MG tablet   . vitamin E 400 UNIT capsule Take 400 Units by mouth daily.   No facility-administered encounter medications on file as of 02/08/2018.    Allergies  Allergen Reactions  . Morphine And Related Nausea And Vomiting    DILAUDID IS OK    Past Medical History:  Diagnosis Date  . Anemia   . Anxiety   . ASCUS on Pap smear   . Cervical cancer ([place]) dx 04-06-2013  [facility]/  [facility] DR Sondra Come FOR BRACHYTHERAPY (HIGH DOSE RADIATION VIA TANDEM RING)   STAGE IIB   T1b2, N1,  M0  invasive differentiated squamous cell carcinoma, FIGO  . Complication of anesthesia    pt has better pain control w dilaudid than morphine  . Factor V Leiden mutation ([place])   . Fatigue    SECONDARY TO CHEMORADIATION  . History of pulmonary embolism   . History of radiation therapy 05/11/13, 05/17/13, 05/22/13, 05/31/13, 06/07/13   27.5 gray to cervix  . Hypothyroidism   . MRSA (methicillin resistant staph aureus) culture positive    LEFT BUTTOCK AREA --  USING NEOSPORIN AND TAKING  ORAL ANTIBIOTICS  . Ovarian cyst   . S/P PICC central line placement    Past Surgical History:  Procedure Laterality Date  . CERVICAL BIOPSY  03/17/2013  . CERVICAL CONIZATION W/BX  2009  . TANDEM RING INSERTION N/A 05/11/2013   Procedure: EXAMINATION UNDER ANETHESIA, PLACEMENT OF GOLD MARKERS, PLACEMENT OF TANDEM RING FOR HIGH DOSE RADIATION THERAPY;  Surgeon: Blair Promise, MD;  Location: [facility];  Service: Urology;  Laterality: N/A;  . TANDEM RING INSERTION N/A 05/17/2013   Procedure: TANDEM RING PLACEMENT;  Surgeon: Blair Promise,  MD;  Location: [facility];  Service: Urology;  Laterality: N/A;  . TANDEM RING INSERTION N/A 05/22/2013   Procedure: TANDEM RING PLACEMENT;  Surgeon: Blair Promise, MD;  Location: [facility];  Service: Urology;  Laterality: N/A;  . TANDEM RING INSERTION N/A 05/31/2013   Procedure: TANDEM RING INSERTION;  Surgeon: Blair Promise, MD;  Location: [facility];  Service: Urology;  Laterality: N/A;  . TANDEM RING INSERTION N/A 06/07/2013   Procedure: TANDEM RING INSERTION;  Surgeon: Blair Promise, MD;  Location: [facility];  Service: Urology;  Laterality: N/A;        Past Gynecological History:   GYNECOLOGIC HISTORY:  . No LMP recorded. *** . Menarche: *** years old . P *** . Contraceptive*** . HRT ***  . Last Pap *** Family Hx:  Family History  Problem Relation Age of Onset  . Stroke Mother    Social  Hx:  . Tobacco use: *** . Alcohol use: *** . Illicit Drug use: *** . Illicit IV Drug use: ***    Review of Systems: Review of Systems - Oncology ***  Vitals: There were no vitals filed for this visit. There were no vitals filed for this visit. There is no height or weight on file to calculate BMI.  Physical Exam: General :  ***Well developed, 57 y.o., female in no apparent distress HEENT:  Normocephalic/atraumatic, symmetric, EOMI, eyelids normal Neck:   Supple, no masses.  Lymphatics:  No cervical/ submandibular/ supraclavicular/ infraclavicular/ inguinal adenopathy Respiratory:  Respirations unlabored, no use of accessory muscles CV:   Deferred Breast:  Deferred Musculoskeletal: No CVA tenderness, normal muscle strength. Abdomen:  *** Soft, non-tender and nondistended. No evidence of hernia. No masses. Extremities:  No lymphedema, no erythema, non-tender. Skin:   Normal inspection Neuro/Psych:  No focal motor deficit, no abnormal mental status. Normal gait. Normal affect. Alert and oriented to person, place, and  time  Genito Urinary: Vulva: ***Normal external female genitalia.  Bladder/urethra: Urethral meatus normal in size and location. No lesions or   masses, well supported bladder ***Speculum exam: Vagina: ***No lesion, no discharge, no bleeding. Cervix: ***Normal appearing, no lesions. Bimanual exam: *** Uterus: ***Normal size, mobile.  Adnexa: ***No masses. Rectovaginal:  ***Good tone, no masses, no cul de sac nodularity, no parametrial involvement or nodularity.   Assessment  *** ECOG PERFORMANCE STATUS: {CHL ONC ECOG Ps:[phone]}  Plan  1. Complexity of visit ? This is a new problem and *** additional workup is planned ? Data reviewed ? *** I independently reviewed the images and the radiology reports and discussed my interpretation in the presence of *** ? *** ? I reviewed her referring doctor's office notes and I have summarized in the HPI ? History was obtained from *** ? We reviewed *** tumor marker ? This is an ***acute illness that may pose a threat to life ***undiagnosed new problem with uncertain prognosis (lump/mass) 2. *** ? *** 3. *** ? *** 4. *** o ***    Tammie Marsh  is a 57 y.o.  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  (Pelvic LN  mets noted on PET imaging) Definitive CDDP chemoradiaiton was completed in Feb 2015. She has received 4 c ycles adjuvant taxol/carboplaitn completed.10/03/2013. She's clinically free of disease.   Return to see [place] 01/2016.  Ms. Platas reports that Dr. Bobby Rumpf plans to assess her with imaging in May 2017.   Vaginal bleeding likely due to radiation changes. We discussed the use of local estrogen however given her prior pulmonary embolism Ms. Koehne is reluctant to expose herself to any chance of increased systemic estrogen.   Face to face time with patient was *** minutes. Over 50% of this time was spent on counseling and coordination of care.   Mart Piggs, MD Gynecologic Oncologist 02/08/2018,  8:34 AM    Cc: *** (Referring ***) *** (PCP)

## 2018-02-09 ENCOUNTER — Encounter: Payer: Self-pay | Admitting: Gynecologic Oncology

## 2018-02-24 ENCOUNTER — Telehealth: Payer: Self-pay

## 2018-02-24 NOTE — Telephone Encounter (Signed)
Incoming call from patient, she reports needs to make a f/u appt, verbalized wanted to follow up but has been hard to due to her "brother committed suicide."  Pt prefers female provider and would like appt week of Nov 11th, while attempting to make her appt, she said she had to get back to work and would call us back in 2 hours.

## 2018-02-25 ENCOUNTER — Inpatient Hospital Stay: Payer: 59 | Attending: Obstetrics | Admitting: Obstetrics

## 2018-02-25 ENCOUNTER — Other Ambulatory Visit (HOSPITAL_COMMUNITY)
Admission: RE | Admit: 2018-02-25 | Discharge: 2018-02-25 | Disposition: A | Discharge status: Home / Self Care | Payer: 59 | Source: Ambulatory Visit | Attending: Obstetrics | Admitting: Obstetrics

## 2018-02-25 ENCOUNTER — Encounter: Payer: Self-pay | Admitting: Obstetrics

## 2018-02-25 VITALS — BP 133/76 | HR 86 | Temp 98.1°F | Resp 20 | Ht 65.0 in | Wt 171.0 lb

## 2018-02-25 DIAGNOSIS — N952 Postmenopausal atrophic vaginitis: Secondary | ICD-10-CM | POA: Diagnosis not present

## 2018-02-25 DIAGNOSIS — C539 Malignant neoplasm of cervix uteri, unspecified: Secondary | ICD-10-CM | POA: Diagnosis present

## 2018-02-25 DIAGNOSIS — Z9221 Personal history of antineoplastic chemotherapy: Secondary | ICD-10-CM | POA: Diagnosis not present

## 2018-02-25 DIAGNOSIS — Z923 Personal history of irradiation: Secondary | ICD-10-CM | POA: Insufficient documentation

## 2018-02-25 DIAGNOSIS — R102 Pelvic and perineal pain: Secondary | ICD-10-CM

## 2018-02-25 MED ORDER — ESTROGENS, CONJUGATED 0.625 MG/GM VA CREA: 1.0000 | TOPICAL_CREAM | VAGINAL | 12 refills | Status: DC

## 2018-02-25 NOTE — Progress Notes (Signed)
[place] at [facility]   Progress Note: Established Patient Surveillance Visit    Chief Complaint  Patient presents with  . Malignant neoplasm of cervix, unspecified site [facility])    GYN Oncologic Summary 1. 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 Sondra Come at [facility]) o 10/03/2013 completed adjuvant Carbo/Tax x 4 cycles (Completed in [place])  HPI: Tammie Marsh  is a pleasant 57 y.o.     Interval History C/o vaginal bleeding, multiple other complaints that look to be present on 2017 visit including neuropathy and bilateral LE swelling. Her PCP is prescribing gabapentin.  Has not been using dilator but did recently use Monistat vaginal cream because she thought she had a yeast infection. Worried about weight gain.   Oncologic Course [name] presented to emergency room on 03/13/2013 with heavy vaginal bleeding. A CT scan of the abdomen and pelvis was notable for a mass expanding the cervix and lower uterine segment measuring 7.9 x 4.7 cm x 3 cm a solid right adnexal mass measuring 5.5 cm in size was also noted this was thought to reflect a pedunculated fibroid. There is a single enhancing left external iliac lymph node measuring 14 mm in the short axis. Patient was taken to the operating room on 03/17/2013 at which time there was a biopsy performed of the cervical mass and an aborted hysteroscopy D&C. Pathology was consistent with squamous mucosa with high-grade squamous dysplasia suspicious for invasive squamous cell carcinoma.  On the presenting physical examination there was a 7.5 cm cervix with extension to bilateral parametria.The CT scan demonstrates possible left iliac lymph node involvement. A PET confirmed this suspicion. The patient then received her external beam radiation therapy at the [facility] in [place] and intracavitary brachytherapy treatments in  [place]. (completed June 07, 2013).   After primary chemoradiation, she received 4 cycles taxol/carbo completed 10/03/2013. PET 11/14/2013 without evidence of hypermetabolic changes. Benign right adnexal process appreciated 3.8x4.7cm  Measurement of disease: Exam, annual pap   Radiology: . See HPI and Epic chart for early imaging  Outpatient Encounter Medications as of 02/25/2018  Medication Sig  . Ascorbic Acid (VITAMIN C) 1000 MG tablet Take 1,000 mg by mouth.  Marland Kitchen atorvastatin (LIPITOR) 20 MG tablet Take by mouth daily.  . Biotin 10 MG TABS Take 10 mg by mouth daily.   . cholecalciferol (VITAMIN D) 1000 UNITS tablet Take 1,000 Units by mouth daily.  . clonazePAM (KLONOPIN) 2 MG tablet Take 2 mg by mouth 2 (two) times daily.   . cyanocobalamin 1000 MCG tablet Take 100 mcg by mouth daily.  Marland Kitchen gabapentin (NEURONTIN) 300 MG capsule Take 900 mg by mouth 3 (three) times daily.   Marland Kitchen levothyroxine (SYNTHROID, LEVOTHROID) 100 MCG tablet take 1 tablet by mouth once daily ON AN EMPTY STOMACH  . PARoxetine (PAXIL) 30 MG tablet Take 60 mg by mouth 2 (two) times daily at 10 AM and 5 PM.   . vitamin E 400 UNIT capsule Take 400 Units by mouth daily.  Derrill Memo ON 02/28/2018] conjugated estrogens (PREMARIN) vaginal cream Place 1 Applicatorful vaginally 2 (two) times a week.  . DULoxetine (CYMBALTA) 30 MG capsule Take 30 mg by mouth.  . DULoxetine (CYMBALTA) 60 MG capsule Take 60 mg by mouth.  . oxybutynin (DITROPAN) 5 MG tablet Take 5 mg by mouth daily.   . pregabalin (LYRICA) 50 MG capsule Take 50 mg  by mouth.  . triamterene-hydrochlorothiazide (MAXZIDE-25) 37.5-25 MG tablet    No facility-administered encounter medications on file as of 02/25/2018.    Allergies  Allergen Reactions  . Morphine And Related Nausea And Vomiting    DILAUDID IS OK    Past Medical History:  Diagnosis Date  . Anemia   . Anxiety   . ASCUS on Pap smear   . 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 Complication of anesthesia    pt has better pain control w dilaudid than morphine  . Factor V Leiden mutation ([place])   . Fatigue    SECONDARY TO CHEMORADIATION  . History of pulmonary embolism   . History of radiation therapy 05/11/13, 05/17/13, 05/22/13, 05/31/13, 06/07/13   27.5 gray to cervix  . Hypothyroidism   . MRSA (methicillin resistant staph aureus) culture positive    LEFT BUTTOCK AREA --  USING NEOSPORIN AND TAKING  ORAL ANTIBIOTICS  . Ovarian cyst   . S/P PICC central line placement    Past Surgical History:  Procedure Laterality Date  . CERVICAL BIOPSY  03/17/2013  . CERVICAL CONIZATION W/BX  2009  . TANDEM RING INSERTION N/A 05/11/2013   Procedure: EXAMINATION UNDER ANETHESIA, PLACEMENT OF GOLD MARKERS, PLACEMENT OF TANDEM RING FOR HIGH DOSE RADIATION THERAPY;  Surgeon: Blair Promise, MD;  Location: [facility];  Service: Urology;  Laterality: N/A;  . TANDEM RING INSERTION N/A 05/17/2013   Procedure: TANDEM RING PLACEMENT;  Surgeon: Blair Promise, MD;  Location: [facility];  Service: Urology;  Laterality: N/A;  . TANDEM RING INSERTION N/A 05/22/2013   Procedure: TANDEM RING PLACEMENT;  Surgeon: Blair Promise, MD;  Location: [facility];  Service: Urology;  Laterality: N/A;  . TANDEM RING INSERTION N/A 05/31/2013   Procedure: TANDEM RING INSERTION;  Surgeon: Blair Promise, MD;  Location: [facility];  Service: Urology;  Laterality: N/A;  . TANDEM RING INSERTION N/A 06/07/2013   Procedure: TANDEM RING INSERTION;  Surgeon: Blair Promise, MD;  Location: [facility];  Service: Urology;  Laterality: N/A;        Past Gynecological History:   GYNECOLOGIC HISTORY:  . No LMP recorded.  Family Hx:  Family History   Problem Relation Age of Onset  . Stroke Mother    Social Hx:  Marland Kitchen Tobacco use: none . Alcohol use: none . Illicit Drug use: none    Review of Systems: Review of Systems  Constitutional: Positive for fatigue and unexpected weight change.  HENT:   Positive for tinnitus.   Respiratory: Positive for shortness of breath.   Cardiovascular: Positive for leg swelling.  Genitourinary: Positive for frequency, pelvic pain and vaginal bleeding.   Musculoskeletal: Positive for back pain.  Neurological: Positive for dizziness and headaches.  Psychiatric/Behavioral: The patient is nervous/anxious.   All other systems reviewed and are negative.  Vitals:  Vitals:   02/25/18 1553  BP: 133/76  Pulse: 86  Resp: 20  Temp: 98.1 F (36.7 C)  SpO2: 99%   Vitals:   02/25/18 1553  Weight: 171 lb (77.6 kg)  Height: 5\' 5"  (1.651 m)   Body mass index is 28.46 kg/m.  Physical Exam: General :  Overweight, well developed, 57 y.o., female  in no apparent distress HEENT:  Normocephalic/atraumatic, symmetric, EOMI, eyelids normal Neck:   Supple, no masses.  Lymphatics:  No cervical/ submandibular/ supraclavicular/ infraclavicular/ inguinal adenopathy Respiratory:  Respirations unlabored, no use of accessory muscles CV:   Deferred Breast:  Deferred Musculoskeletal: No CVA tenderness, normal muscle strength. Abdomen:  Overweight. Soft, non-tender and nondistended. No evidence of hernia. No masses. Extremities:  No lymphedema, no erythema, non-tender. Skin:   Normal inspection Neuro/Psych:  No focal motor deficit, no abnormal mental status. Normal gait. Normal affect. Alert and oriented to person, place, and time  Genito Urinary: Vulva: Normal external female genitalia.  Bladder/urethra: Urethral meatus normal in size and location. No lesions or   masses, well supported bladder Speculum exam: Vagina: + radiation atrophy with fibrosis and shortening. Pediatric speculum required. No lesion, no  discharge, minimal bleeding.  Cervix: Unable to visualize due to agglutination' Bimanual exam:   Adnexal region: No masses. Rectovaginal:  Fibrosis in bilateral parametria, no nodularity or specific mass. Good tone, no masses.   Assessment  Stage IB2 cervical cancer with + node on PET  Plan  1. Neuropathy ? Discussed continuation of the the gabapentin ? Accupuncture an option, but she would have to pay out of pocket ? PTx consult possible. Defer to PCP 2. Weight gain and LE edema per PCP ? We did talk about compression stockings 3. Vaginal atrophy ? H/o pulmonary embolus and Factor 5 Leiden therefore no estrogen  ? We'll continue to monitor 4. Surveillance Plan ? RTC Q6 months until 09/2018 for pelvic i. Then annual with [place] or with the medical oncologist who treated her in [place]. o Plan next Pap ~02/2019 5. Groin pain ? Check CT scan  Face to face time with patient was 25 minutes. Over 50% of this time was spent on counseling and coordination of care.   Mart Piggs, MD Gynecologic Oncologist 02/25/2018, 4:49 PM    Cc: Janie Morning, DO (PCP)

## 2018-02-25 NOTE — Patient Instructions (Signed)
1. Return to clinic in 6 months 2. Use Premarin vaginal cream twice weekly with vaginal applicator 3. Try to use dilator 2 days a week (before you use the cream might be a good time)

## 2018-03-01 ENCOUNTER — Telehealth: Payer: Self-pay | Admitting: *Deleted

## 2018-03-01 NOTE — Telephone Encounter (Signed)
Returned the patient's call, gave her the number to [employer]. Also left message that we are in the process of getting the scan approved with her insurance

## 2018-03-02 LAB — CYTOLOGY - PAP
Diagnosis: NEGATIVE
Diagnosis: REACTIVE

## 2018-03-03 ENCOUNTER — Telehealth: Payer: Self-pay | Admitting: *Deleted

## 2018-03-03 NOTE — Telephone Encounter (Signed)
Patient called and left a message stating "I would like Dr.Phelps to consult with Dr.Lewis in [place]. He did my chemo and radiation. Also to work on the scan. I called and spoke with the people doing the scan, they stated that the scan will not focus on my liver. I need it to look /focus on my liver too. It's already in my lymph nodes and if it is going to spread it will on to the liver first. I know she is focus/looking at the place I have pain."

## 2018-03-05 ENCOUNTER — Ambulatory Visit
Admission: RE | Admit: 2018-03-05 | Discharge: 2018-03-05 | Disposition: A | Discharge status: Home / Self Care | Payer: 59 | Source: Ambulatory Visit | Attending: Obstetrics | Admitting: Obstetrics

## 2018-03-05 DIAGNOSIS — R102 Pelvic and perineal pain: Secondary | ICD-10-CM

## 2018-03-05 MED ORDER — IOPAMIDOL (ISOVUE-300) INJECTION 61%
100.0000 mL | Freq: Once | INTRAVENOUS | Status: AC | PRN
  Administered 2018-03-05: 100 mL via INTRAVENOUS

## 2018-03-08 ENCOUNTER — Telehealth: Payer: Self-pay | Admitting: *Deleted

## 2018-03-08 NOTE — Telephone Encounter (Signed)
Called patient to notify her of her CT scan results from 03/05/2018.  Per Joylene John, NP, no evidence of cancer. Patient verbalized understanding.

## 2018-06-27 ENCOUNTER — Telehealth: Payer: Self-pay | Admitting: *Deleted

## 2018-06-27 NOTE — Telephone Encounter (Signed)
Called and left the patient a message to call the office back. Need to move her appt from 5/4 with Dr. Gerarda Fraction to Dr. [name].

## 2018-06-28 ENCOUNTER — Telehealth: Payer: Self-pay | Admitting: *Deleted

## 2018-06-28 NOTE — Telephone Encounter (Signed)
Called and left the patient a message to call the office back. Need to move her appt from 5/4 with DR. Phelps to either 5/14 or 5/28 with Dr. [name], per [facility] APP

## 2018-06-29 ENCOUNTER — Telehealth: Payer: Self-pay | Admitting: *Deleted

## 2018-06-29 NOTE — Telephone Encounter (Signed)
Attempted to contact the patient regarding her appt in May with Dr. Gerarda Fraction. Left a message to a call the office back to reschedule. When the patient calls back I will speak with her.

## 2018-06-30 ENCOUNTER — Telehealth: Payer: Self-pay | Admitting: *Deleted

## 2018-06-30 NOTE — Telephone Encounter (Signed)
Patient returned call, explained that Dr. Gerarda Fraction is no longer with the practice. Patient offered and accepted to see Dr. [name].  Appt offered per [facility] APP

## 2018-08-29 ENCOUNTER — Ambulatory Visit: Payer: 59 | Admitting: Obstetrics

## 2018-09-22 ENCOUNTER — Ambulatory Visit: Payer: 59 | Admitting: Gynecologic Oncology

## 2019-11-22 ENCOUNTER — Inpatient Hospital Stay: Payer: 59 | Admitting: Obstetrics & Gynecology

## 2019-11-22 DIAGNOSIS — C539 Malignant neoplasm of cervix uteri, unspecified: Secondary | ICD-10-CM

## 2019-12-19 ENCOUNTER — Telehealth: Payer: Self-pay | Admitting: *Deleted

## 2019-12-19 NOTE — Telephone Encounter (Signed)
Patient called and rescheduled her appt from 8/28 to 9/22

## 2019-12-20 ENCOUNTER — Inpatient Hospital Stay: Payer: 59 | Admitting: Obstetrics & Gynecology

## 2020-01-16 NOTE — Assessment & Plan Note (Deleted)
59 yo w/a h/o stage IIIB cervical cancer Negative symptom review, normal exam.  No evidence of recurrence  >Return in 1 yr

## 2020-01-17 ENCOUNTER — Inpatient Hospital Stay: Payer: 59 | Attending: Obstetrics & Gynecology | Admitting: Obstetrics & Gynecology

## 2020-08-05 ENCOUNTER — Telehealth: Payer: Self-pay | Admitting: *Deleted

## 2020-08-05 NOTE — Telephone Encounter (Signed)
Returned the patient's call and scheduled a follow up appt for 5/4

## 2020-08-27 ENCOUNTER — Encounter: Payer: Self-pay | Admitting: Obstetrics & Gynecology

## 2020-08-27 NOTE — Progress Notes (Unsigned)
Follow Up Note: Gyn-Onc  Tammie Marsh 60 y.o. female  CC: She presents for a surveillance visit  HPI:  Oncology History  Cervical cancer ([place])  04/06/2013 Initial Diagnosis   Cervical cancer    - 10/03/2013 Chemotherapy   4 cycles taxo/carbo RT    11/08/2013 Imaging   No evidence of hypermetabolic changes. Benign right adnexal process 3.8 x 4.7 cm   03/06/2018 Imaging   IMPRESSION: 1. No acute abnormality within the abdomen or pelvis. 2. No evidence of recurrent or metastatic endocervical carcinoma. 3. Involuting uterine fibroid   She has used  vaginal dilators and has had significant pelvic pain. She has been unable to be sexually active. Treatment sequelae have included neuropathy and LE venous stasis vs lymphedema.  She has been treated w/neuropathic medications, PFPT and CPT.    Interval History: She denies any vaginal bleeding, abdominal/pelvic pain, leg pain, urinary symptoms, cough or weight loss,    Review of Systems {ros-complete:30496}  Current Meds:  Outpatient Encounter Medications as of 08/28/2020  Medication Sig  . Ascorbic Acid (VITAMIN C) 1000 MG tablet Take 1,000 mg by mouth.  Marland Kitchen atorvastatin (LIPITOR) 20 MG tablet Take by mouth daily.  . Biotin 10 MG TABS Take 10 mg by mouth daily.   . cholecalciferol (VITAMIN D) 1000 UNITS tablet Take 1,000 Units by mouth daily.  . clonazePAM (KLONOPIN) 2 MG tablet Take 2 mg by mouth 2 (two) times daily.   . cyanocobalamin 1000 MCG tablet Take 100 mcg by mouth daily.  . DULoxetine (CYMBALTA) 30 MG capsule Take 30 mg by mouth.  . DULoxetine (CYMBALTA) 60 MG capsule Take 60 mg by mouth.  . gabapentin (NEURONTIN) 300 MG capsule Take 900 mg by mouth 3 (three) times daily.   Marland Kitchen levothyroxine (SYNTHROID, LEVOTHROID) 100 MCG tablet take 1 tablet by mouth once daily ON AN EMPTY STOMACH  . oxybutynin (DITROPAN) 5 MG tablet Take 5 mg by mouth daily.   Marland Kitchen  PARoxetine (PAXIL) 30 MG tablet Take 60 mg by mouth 2 (two) times daily at 10 AM and 5 PM.   . pregabalin (LYRICA) 50 MG capsule Take 50 mg by mouth.  . triamterene-hydrochlorothiazide (MAXZIDE-25) 37.5-25 MG tablet   . vitamin E 400 UNIT capsule Take 400 Units by mouth daily.   No facility-administered encounter medications on file as of 08/28/2020.    Allergy:  Allergies  Allergen Reactions  . Morphine And Related Nausea And Vomiting    DILAUDID IS OK    Social Hx:   Social History   Socioeconomic History  . Marital status: Single    Spouse name: Not on file  . Number of children: Not on file  . Years of education: Not on file  . Highest education level: Not on file  Occupational History  . Not on file  Tobacco Use  . Smoking status: Never Smoker  . Smokeless tobacco: Never Used  Substance and Sexual Activity  . Alcohol use: No  . Drug use: No  . Sexual activity: Never  Other Topics Concern  . Not on file  Social History Narrative  . Not on file   Social Determinants of Health   Financial Resource Strain: Not on file  Food Insecurity: Not on file  Transportation Needs: Not on file  Physical Activity: Not on file  Stress: Not on file  Social Connections: Not on file  Intimate Partner Violence: Not on file    Past Surgical Hx:  Past Surgical History:  Procedure Laterality  Date  . CERVICAL BIOPSY  03/17/2013  . CERVICAL CONIZATION W/BX  2009  . TANDEM RING INSERTION N/A 05/11/2013   Procedure: EXAMINATION UNDER ANETHESIA, PLACEMENT OF GOLD MARKERS, PLACEMENT OF TANDEM RING FOR HIGH DOSE RADIATION THERAPY;  Surgeon: Blair Promise, MD;  Location[facility];  Service: Urology;  Laterality: N/A;  . TANDEM RING INSERTION N/A 05/17/2013   Procedure: TANDEM RING PLACEMENT;  Surgeon: Blair Promise, MD;  Location: [facility];  Service: Urology;  Laterality: N/A;  . TANDEM RING INSERTION N/A 05/22/2013   Procedure: TANDEM RING PLACEMENT;   Surgeon: Blair Promise, MD;  Location: [facility];  Service: Urology;  Laterality: N/A;  . TANDEM RING INSERTION N/A 05/31/2013   Procedure: TANDEM RING INSERTION;  Surgeon: Blair Promise, MD;  Location: [facility];  Service: Urology;  Laterality: N/A;  . TANDEM RING INSERTION N/A 06/07/2013   Procedure: TANDEM RING INSERTION;  Surgeon: Blair Promise, MD;  Location: [facility];  Service: Urology;  Laterality: N/A;    Past Medical Hx:  Past Medical History:  Diagnosis Date  . Anemia   . Anxiety   . ASCUS on Pap smear   . Cervical cancer ([place]) dx 04-06-2013  [facility] FOR [facility]IATION/  [facility] DR Sondra Come FOR BRACHYTHERAPY (HIGH DOSE RADIATION VIA TANDEM RING)   STAGE IIB   T1b2, N1,  M0  invasive differentiated squamous cell carcinoma, FIGO  . Complication of anesthesia    pt has better pain control w dilaudid than morphine  . Factor V Leiden mutation ([place])   . Fatigue    SECONDARY TO CHEMORADIATION  . History of pulmonary embolism   . History of radiation therapy 05/11/13, 05/17/13, 05/22/13, 05/31/13, 06/07/13   27.5 gray to cervix  . Hypothyroidism   . MRSA (methicillin resistant staph aureus) culture positive    LEFT BUTTOCK AREA --  USING NEOSPORIN AND TAKING  ORAL ANTIBIOTICS  . Ovarian cyst   . S/P PICC central line placement     Family Hx:  Family History  Problem Relation Age of Onset  . Stroke Mother     Vitals:  There were no vitals taken for this visit.  Physical Exam: *** {Physical WIOX:[id number]}  Assessment/Plan: [name], NP 08/27/2020, 2:45 PM

## 2020-08-27 NOTE — Assessment & Plan Note (Signed)
60 y.o. with Stage Ib2 cervical cancer treated in 2015 who presents for routine surveillance. Negative symptom review, normal exam.  No evidence of recurrence  Return prn or 1 yr; f/u w/a gynecologic generalist is appropriate

## 2020-08-28 ENCOUNTER — Inpatient Hospital Stay: Payer: 59 | Admitting: Obstetrics & Gynecology

## 2020-08-28 DIAGNOSIS — C539 Malignant neoplasm of cervix uteri, unspecified: Secondary | ICD-10-CM

## 2020-09-04 ENCOUNTER — Telehealth: Payer: Self-pay | Admitting: *Deleted

## 2020-09-04 NOTE — Telephone Encounter (Signed)
Called and left the patient a message to call the office to reschedule her missed appt

## 2020-09-24 NOTE — Progress Notes (Unsigned)
Follow Up Note: Gyn-Onc  Tammie Marsh 60 y.o. female  CC: She presents for a surveillance visit  HPI:  Oncology History  Cervical cancer ([place])  04/06/2013 Initial Diagnosis   Cervical cancer    - 10/03/2013 Chemotherapy   4 cycles taxo/carbo RT    11/08/2013 Imaging   No evidence of hypermetabolic changes. Benign right adnexal process 3.8 x 4.7 cm   03/06/2018 Imaging   IMPRESSION: 1. No acute abnormality within the abdomen or pelvis. 2. No evidence of recurrent or metastatic endocervical carcinoma. 3. Involuting uterine fibroid   She has used  vaginal dilators and has had significant pelvic pain. She has been unable to be sexually active. Treatment sequelae have included neuropathy and LE venous stasis vs lymphedema.  She has been treated w/neuropathic medications, PFPT and CPT.    Interval History: She denies any vaginal bleeding, abdominal/pelvic pain, leg pain, urinary symptoms, cough or weight loss,    Review of Systems {ros-complete:30496}  Current Meds:  Outpatient Encounter Medications as of 09/25/2020  Medication Sig  . acyclovir (ZOVIRAX) 200 MG capsule Take 200 mg by mouth 2 (two) times daily.  Marland Kitchen ACZONE 7.5 % GEL Apply topically at bedtime. Apply to face  . ARIPiprazole (ABILIFY) 2 MG tablet Take 2 mg by mouth in the morning and at bedtime.  . Ascorbic Acid (VITAMIN C) 1000 MG tablet Take 1,000 mg by mouth.  Marland Kitchen atorvastatin (LIPITOR) 20 MG tablet Take 20 mg by mouth daily.  . Biotin 10 MG TABS Take 10 mg by mouth daily.   . cholecalciferol (VITAMIN D) 1000 UNITS tablet Take 1,000 Units by mouth daily.  . cyanocobalamin 1000 MCG tablet Take 100 mcg by mouth daily.  . DULoxetine (CYMBALTA) 30 MG capsule Take 30 mg by mouth.  . gabapentin (NEURONTIN) 800 MG tablet Take 800 mg by mouth 4 (four) times daily.  Marland Kitchen levothyroxine (SYNTHROID) 112 MCG tablet Take 112 mcg by mouth daily.  . Multiple  Vitamin (MULTI-VITAMIN) tablet Take 1 tablet by mouth daily.  Marland Kitchen venlafaxine XR (EFFEXOR-XR) 150 MG 24 hr capsule Take 1 capsule by mouth daily.  . vitamin E 400 UNIT capsule Take 400 Units by mouth daily.   No facility-administered encounter medications on file as of 09/25/2020.    Allergy:  Allergies  Allergen Reactions  . Morphine And Related Nausea And Vomiting    DILAUDID IS OK    Social Hx:   Social History   Socioeconomic History  . Marital status: Single    Spouse name: Not on file  . Number of children: Not on file  . Years of education: Not on file  . Highest education level: Not on file  Occupational History  . Not on file  Tobacco Use  . Smoking status: Never Smoker  . Smokeless tobacco: Never Used  Vaping Use  . Vaping Use: Never used  Substance and Sexual Activity  . Alcohol use: No  . Drug use: No  . Sexual activity: Never  Other Topics Concern  . Not on file  Social History Narrative  . Not on file   Social Determinants of Health   Financial Resource Strain: Not on file  Food Insecurity: Not on file  Transportation Needs: Not on file  Physical Activity: Not on file  Stress: Not on file  Social Connections: Not on file  Intimate Partner Violence: Not on file    Past Surgical Hx:  Past Surgical History:  Procedure Laterality Date  . CERVICAL BIOPSY  03/17/2013  .  CERVICAL CONIZATION W/BX  2009  . TANDEM RING INSERTION N/A 05/11/2013   Procedure: EXAMINATION UNDER ANETHESIA, PLACEMENT OF GOLD MARKERS, PLACEMENT OF TANDEM RING FOR HIGH DOSE RADIATION THERAPY;  Surgeon: Tammie Promise, MD;  [facility];  Service: Urology;  Laterality: N/A;  . TANDEM RING INSERTION N/A 05/17/2013   Procedure: TANDEM RING PLACEMENT;  Surgeon: Tammie Promise, MD;  Location: [facility];  Service: Urology;  Laterality: N/A;  . TANDEM RING INSERTION N/A 05/22/2013   Procedure: TANDEM RING PLACEMENT;  Surgeon: Tammie Promise, MD;   Location: [facility];  Service: Urology;  Laterality: N/A;  . TANDEM RING INSERTION N/A 05/31/2013   Procedure: TANDEM RING INSERTION;  Surgeon: Tammie Promise, MD;  Location: [facility];  Service: Urology;  Laterality: N/A;  . TANDEM RING INSERTION N/A 06/07/2013   Procedure: TANDEM RING INSERTION;  Surgeon: Tammie Promise, MD;  Location: [facility];  Service: Urology;  Laterality: N/A;    Past Medical Hx:  Past Medical History:  Diagnosis Date  . Anemia   . Anxiety   . ASCUS on Pap smear   . Cervical cancer ([place]) dx 04-06-2013  [facility] FOR [facility]ATION/  [facility] DR Tammie Marsh FOR BRACHYTHERAPY (HIGH DOSE RADIATION VIA TANDEM RING)   STAGE IIB   T1b2, N1,  M0  invasive differentiated squamous cell carcinoma, FIGO  . Complication of anesthesia    pt has better pain control w dilaudid than morphine  . Factor V Leiden mutation ([place])   . Fatigue    SECONDARY TO CHEMORADIATION  . History of pulmonary embolism   . History of radiation therapy 05/11/13, 05/17/13, 05/22/13, 05/31/13, 06/07/13   27.5 gray to cervix  . Hypothyroidism   . MRSA (methicillin resistant staph aureus) culture positive    LEFT BUTTOCK AREA --  USING NEOSPORIN AND TAKING  ORAL ANTIBIOTICS  . Ovarian cyst   . S/P PICC central line placement     Family Hx:  Family History  Problem Relation Age of Onset  . Stroke Mother     Vitals:  There were no vitals taken for this visit.  Physical Exam: *** {Physical ERXV:[id number]}  Assessment/Plan: Tammie Crocker, NP 09/24/2020, 11:42 AM

## 2020-09-24 NOTE — Assessment & Plan Note (Signed)
H/O Stage Ib2 cervical cancer, treatment completed in 2015 Unremarkable exam  >follow-up in 1 yr with a gynecologic generalist is appropriate

## 2020-09-25 ENCOUNTER — Inpatient Hospital Stay: Payer: 59 | Admitting: Obstetrics & Gynecology

## 2020-09-25 ENCOUNTER — Telehealth: Payer: Self-pay

## 2020-09-25 DIAGNOSIS — C539 Malignant neoplasm of cervix uteri, unspecified: Secondary | ICD-10-CM

## 2020-09-25 NOTE — Telephone Encounter (Signed)
LM for Tammie Marsh to call back to r/s appointment today with Dr. Delsa Sale as she had to leave for a meeting for an ill  family member.

## 2020-09-27 ENCOUNTER — Telehealth: Payer: Self-pay

## 2020-09-27 NOTE — Telephone Encounter (Signed)
Tammie Marsh called this morning to reschedule her appointment. Her original appointment was on 6/1 with Dr. Delsa Sale. Dr. Anson Crofts next clinic days will be June 22nd and July 6th. Chellie states those days will not work since she will be working. She requests to see a different provider.  Informed patient I will follow up with her later today. Patient verbalized understanding and said it was ok to leave her voicemail.

## 2020-10-02 NOTE — Telephone Encounter (Signed)
Spoke with Tammie Marsh this afternoon to setup another appointment. She is scheduled with Dr. Berline Lopes on June 15th at 3pm. Patient verbalized understanding and is in agreement.

## 2020-10-09 ENCOUNTER — Ambulatory Visit: Payer: 59 | Admitting: Gynecologic Oncology

## 2020-10-29 ENCOUNTER — Encounter: Payer: Self-pay | Admitting: Gynecologic Oncology

## 2020-11-04 ENCOUNTER — Telehealth: Payer: Self-pay | Admitting: *Deleted

## 2020-11-04 NOTE — Telephone Encounter (Signed)
Called the patient back and explained "the office has sent you a letter stating; you are no longer able to be seen in our office due to multiple cancel appts and no shows. But the doctors have talked and the office will see you one final time on 8/3." Patient verbalized understanding and accepted the 8/3 appt at 3:30 pm

## 2020-11-22 ENCOUNTER — Telehealth: Payer: Self-pay | Admitting: Oncology

## 2020-11-22 NOTE — Telephone Encounter (Signed)
Tammie Marsh called and needs to reschedule her apt on 11/27/20 because she has started a new job and has to work a 12 hour shift on that day.  She would like to rescheduled to 8/10, 8/24 or 9/7.

## 2020-11-25 NOTE — Telephone Encounter (Addendum)
Pt. called to reschedule her appointment on 11-27-20 as she has to work that day. Explained to her that this appointment which she scheduled was her last one. If she called to r/s,as a letter was sent to her on 10-29-20 regarding appointment adherence as noted below. She can follow up with her regular gyn at this time. Appointments will no longer be scheduled with this practice. Pt hung up the phone.  October 29, 2020   [place] [id number]   Dear Ms. Carrigg,  We are sorry that you missed your appointment with Dr. Berline Lopes on 10/09/2020. Appointments have been cancelled on 09/25/2020, 08/28/2020, 12/20/2019, 11/22/2019, 09/22/2019, 08/29/2018 with a no show on 01/17/2020.   Your health and follow-up medical care are important to [place]. We will not be rescheduling your visit at this time since it has been over 5 years since your cancer diagnosis. You should follow up with a general gynecologist for your well woman care. If you have any questions, please call the office at [phone].   Sincerely,    Dorothyann Gibbs, NP for Dr. Jeral Pinch, Dr. [name]

## 2020-11-27 ENCOUNTER — Ambulatory Visit: Payer: 59 | Admitting: Obstetrics & Gynecology

## 2021-03-03 ENCOUNTER — Telehealth: Payer: Self-pay | Admitting: *Deleted

## 2021-03-03 NOTE — Telephone Encounter (Signed)
Per patient request, fax records to Dr Marvel Plan. Patient aware

## 2021-03-24 ENCOUNTER — Telehealth: Payer: Self-pay | Admitting: *Deleted

## 2021-03-24 NOTE — Telephone Encounter (Signed)
Per patient request, fax records again to Dr Marvel Plan office [phone]

## 2023-05-28 ENCOUNTER — Other Ambulatory Visit: Payer: Self-pay

## 2023-05-28 ENCOUNTER — Inpatient Hospital Stay: Payer: BC Managed Care – PPO

## 2023-05-28 ENCOUNTER — Encounter: Payer: Self-pay | Admitting: Oncology

## 2023-05-28 ENCOUNTER — Other Ambulatory Visit: Payer: Self-pay | Admitting: Oncology

## 2023-05-28 ENCOUNTER — Inpatient Hospital Stay: Payer: BC Managed Care – PPO | Attending: Oncology | Admitting: Oncology

## 2023-05-28 VITALS — BP 139/80 | HR 73 | Temp 97.7°F | Resp 16 | Ht 65.0 in | Wt 154.5 lb

## 2023-05-28 DIAGNOSIS — Z86711 Personal history of pulmonary embolism: Secondary | ICD-10-CM

## 2023-05-28 DIAGNOSIS — D6851 Activated protein C resistance: Secondary | ICD-10-CM | POA: Insufficient documentation

## 2023-05-28 DIAGNOSIS — Z7901 Long term (current) use of anticoagulants: Secondary | ICD-10-CM | POA: Diagnosis not present

## 2023-05-28 DIAGNOSIS — I82412 Acute embolism and thrombosis of left femoral vein: Secondary | ICD-10-CM | POA: Insufficient documentation

## 2023-05-28 MED ORDER — APIXABAN 5 MG PO TABS: 5.0000 mg | ORAL_TABLET | Freq: Two times a day (BID) | ORAL | 5 refills | Status: DC

## 2023-05-30 NOTE — Progress Notes (Unsigned)
[facility]  [address] [place],  [place]  [address] [phone]  Clinic Day:  05/30/2023  Referring physician: Irena Reichmann, DO   HISTORY OF PRESENT ILLNESS:  The patient is a 63 y.o. female who I was asked to consult upon for a left great saphenous vein clot, with early extension into her femoral vein.  Initially, the patient had a CT scan of her abdomen/pelvis for her cervical cancer surveillance, which showed no evidence of disease recurrence.  However, an incidental DVT was seen in her left femoral vein.  This led to a Doppler ultrasound being done of her left leg, which showed clot in her greater saphenous vein, with extension into her femoral vein.  The patient recalls having mild discomfort just medial to her left knee.  However, she denies ever having any significant erythema or swelling in her left leg.  Of note, she had a pulmonary embolus which propagated from a DVT in the late 2000s.  The patient recalls being told she was homozygous for the factor V Leiden mutation.  Since her recent DVT was found, she has been taking Eliquis 5 mg BID.  She comes in today to discuss how to approach her clotting issues moving forward.    PAST MEDICAL HISTORY:   Past Medical History:  Diagnosis Date  . Anemia   . Anxiety   . ASCUS on Pap smear   . Cervical cancer (HCC) dx 04-06-2013  [facility]/  [facility] DR Roselind Messier FOR BRACHYTHERAPY (HIGH DOSE RADIATION VIA TANDEM RING)   STAGE IIB   T1b2, N1,  M0  invasive differentiated squamous cell carcinoma, FIGO  . Complication of anesthesia    pt has better pain control w dilaudid than morphine  . Factor V Leiden mutation (HCC)   . Fatigue    SECONDARY TO CHEMORADIATION  . History of DVT (deep vein thrombosis)   . History of pulmonary embolus (PE)   . History of radiation therapy 05/11/13, 05/17/13, 05/22/13, 05/31/13, 06/07/13   27.5 gray to cervix  . Hypothyroidism   .  MRSA (methicillin resistant staph aureus) culture positive    LEFT BUTTOCK AREA --  USING NEOSPORIN AND TAKING  ORAL ANTIBIOTICS  . Ovarian cyst   . S/P PICC central line placement     PAST SURGICAL HISTORY:   Past Surgical History:  Procedure Laterality Date  . CERVICAL BIOPSY  03/17/2013  . CERVICAL CONIZATION W/BX  2009  . TANDEM RING INSERTION N/A 05/11/2013   Procedure: EXAMINATION UNDER ANETHESIA, PLACEMENT OF GOLD MARKERS, PLACEMENT OF TANDEM RING FOR HIGH DOSE RADIATION THERAPY;  Surgeon: Billie Lade, MD;  Location: [facility];  Service: Urology;  Laterality: N/A;  . TANDEM RING INSERTION N/A 05/17/2013   Procedure: TANDEM RING PLACEMENT;  Surgeon: Billie Lade, MD;  Location: [facility];  Service: Urology;  Laterality: N/A;  . TANDEM RING INSERTION N/A 05/22/2013   Procedure: TANDEM RING PLACEMENT;  Surgeon: Billie Lade, MD;  Location: [facility];  Service: Urology;  Laterality: N/A;  . TANDEM RING INSERTION N/A 05/31/2013   Procedure: TANDEM RING INSERTION;  Surgeon: Billie Lade, MD;  Location: [facility];  Service: Urology;  Laterality: N/A;  . TANDEM RING INSERTION N/A 06/07/2013   Procedure: TANDEM RING INSERTION;  Surgeon: Billie Lade, MD;  Location: [facility];  Service: Urology;  Laterality: N/A;    CURRENT MEDICATIONS:  Current Outpatient Medications  Medication Sig Dispense Refill  . atorvastatin (LIPITOR) 40 MG tablet Take 40 mg by mouth daily.    . dapagliflozin propanediol (FARXIGA) 10 MG TABS tablet Take by mouth daily.    Marland Kitchen glipiZIDE (GLUCOTROL) 5 MG tablet Take by mouth daily before breakfast.    . hydrochlorothiazide (MICROZIDE) 12.5 MG capsule Take 12.5 mg by mouth daily.    Marland Kitchen PARoxetine (PAXIL) 10 MG tablet Take 10 mg by mouth daily.    . Tirzepatide [facility]) Inject into the skin.    Marland Kitchen venlafaxine (EFFEXOR) 37.5 MG tablet Take 37.5 mg by mouth 2 (two) times daily.     Marland Kitchen apixaban (ELIQUIS) 5 MG TABS tablet Take 1 tablet (5 mg total) by mouth 2 (two) times daily. 60 tablet 5  . cholecalciferol (VITAMIN D) 1000 UNITS tablet Take 1,000 Units by mouth daily.    Marland Kitchen gabapentin (NEURONTIN) 800 MG tablet Take 800 mg by mouth 4 (four) times daily.    Marland Kitchen levothyroxine (SYNTHROID) 112 MCG tablet Take 112 mcg by mouth daily.     No current facility-administered medications for this visit.    ALLERGIES:   Allergies  Allergen Reactions  . Morphine And Codeine Nausea And Vomiting    DILAUDID IS OK    FAMILY HISTORY:   Family History  Problem Relation Age of Onset  . Stroke Mother   . Hypertension Mother   . Pneumonia Father   . Diabetes Father   . Diabetes Brother     SOCIAL HISTORY:  The patient was born and raised in [place].  She lives in town.  She is single, with no children.  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  She denies having a history of alcoholism or tobacco abuse.    REVIEW OF SYSTEMS:  Review of Systems  Constitutional:  Negative for fatigue and fever.  HENT:   Negative for hearing loss and sore throat.   Eyes:  Negative for eye problems.  Respiratory:  Negative for chest tightness, cough and hemoptysis.   Cardiovascular:  Negative for chest pain and palpitations.  Gastrointestinal:  Negative for abdominal distention, abdominal pain, blood in stool, constipation, diarrhea, nausea and vomiting.  Endocrine: Negative for hot flashes.  Genitourinary:  Negative for difficulty urinating, dysuria, frequency, hematuria and nocturia.   Musculoskeletal:  Positive for gait problem. Negative for arthralgias, back pain and myalgias.  Skin: Negative.  Negative for itching and rash.  Neurological:  Positive for gait problem. Negative for dizziness, extremity weakness, headaches, light-headedness and numbness.  Hematological: Negative.   Psychiatric/Behavioral: Negative.  Negative for depression and suicidal ideas. The patient  is not nervous/anxious.      PHYSICAL EXAM:  Blood pressure 139/80, pulse 73, temperature 97.7 F (36.5 C), temperature source Oral, resp. rate 16, height 5\' 5"  (1.651 m), weight 154 lb 8 oz (70.1 kg), SpO2 97%. Wt Readings from Last 3 Encounters:  05/28/23 154 lb 8 oz (70.1 kg)  02/25/18 171 lb (77.6 kg)  04/02/16 141 lb 8 oz (64.2 kg)   Body mass index is 25.71 kg/m. Performance status (ECOG): 1 - Symptomatic but completely ambulatory Physical Exam Constitutional:      Appearance: Normal appearance. She is not ill-appearing.  HENT:     Mouth/Throat:     Mouth: Mucous membranes are moist.     Pharynx: Oropharynx is clear. No oropharyngeal exudate or posterior oropharyngeal erythema.  Cardiovascular:     Rate and Rhythm: Normal rate and regular  rhythm.     Heart sounds: No murmur heard.    No friction rub. No gallop.  Pulmonary:     Effort: Pulmonary effort is normal. No respiratory distress.     Breath sounds: Normal breath sounds. No wheezing, rhonchi or rales.  Abdominal:     General: Bowel sounds are normal. There is no distension.     Palpations: Abdomen is soft. There is no mass.     Tenderness: There is no abdominal tenderness.  Musculoskeletal:        General: No swelling.     Right lower leg: No edema.     Left lower leg: No edema.  Lymphadenopathy:     Cervical: No cervical adenopathy.     Upper Body:     Right upper body: No supraclavicular or axillary adenopathy.     Left upper body: No supraclavicular or axillary adenopathy.     Lower Body: No right inguinal adenopathy. No left inguinal adenopathy.  Skin:    General: Skin is warm.     Coloration: Skin is not jaundiced.     Findings: No lesion or rash.  Neurological:     General: No focal deficit present.     Mental Status: She is alert and oriented to person, place, and time. Mental status is at baseline.  Psychiatric:        Mood and Affect: Mood normal.        Behavior: Behavior normal.         Thought Content: Thought content normal.   LABS:      Latest Ref Rng & Units 08/03/2010    7:26 PM 10/30/2009    5:09 PM 07/07/2007    2:37 PM  CBC  WBC 4.0 - 10.5 K/uL 4.3  4.9    Hemoglobin 12.0 - 15.0 g/dL 16.1  09.6  04.5   Hematocrit 36.0 - 46.0 % 38.3  34.3  41.0   Platelets 150 - 400 K/uL 144  179        Latest Ref Rng & Units 04/11/2013    2:15 PM 10/30/2009    5:09 PM 07/07/2007    2:37 PM  CMP  Glucose 70 - 99 mg/dL  92  94   BUN 7.0 - 40.9 mg/dL 81.$[id number]$$KVQQVZDGLOVFIEPP_IRJJOACZYSAYTKZSWFUXNATFTDDUKGUR$ Creatinine 0.6 - 1.1 mg/dL 0.8  4.27  0.9   Sodium 135 - 145 mEq/L  135  136   Potassium 3.5 - 5.1 mEq/L  4.3  4.3   Chloride 96 - 112 mEq/L  103  105   CO2 19 - 32 mEq/L  28    Calcium 8.4 - 10.5 mg/dL  9.1    Total Protein 6.0 - 8.3 g/dL  6.8    Total Bilirubin 0.3 - 1.2 mg/dL  0.6    Alkaline Phos 39 - 117 U/L  48    AST 0 - 37 U/L  15    ALT 0 - 35 U/L  13     ASSESSMENT & PLAN:  A 63 y.o. female who I was asked to consult upon for a left great saphenous vein clot, with early extension into her femoral vein.  The patient states that in the late 2000s, she had a hypercoagulable workup which apparently showed her to be homozygous for the factor V Leiden mutation.  We are in the process of trying to collect this result.  If positive, she would consider to have a  strong hypercoagulable disorder that would relegate her  to lifelong anticoagulation.  We will try to ascertain those results from 15+ years ago.  Of note, the patient has undergone a pap smear/pelvic exam recently, which came back normal.  I do not believe her clot is due to an underlying malignancy.  I will see her back in 2 weeks; hopefully, by then, we can ascertain her hypercoagulable workup from years ago, which would be used to formulate her anticoagulation treatment plan.  The patient understands all the plans discussed today and is in agreement with them.  I do appreciate Irena Reichmann, DO for his new consult.   Osamu Olguin Kirby Funk, MD

## 2023-05-31 DIAGNOSIS — I82419 Acute embolism and thrombosis of unspecified femoral vein: Secondary | ICD-10-CM | POA: Insufficient documentation

## 2023-06-11 ENCOUNTER — Inpatient Hospital Stay: Payer: BC Managed Care – PPO | Attending: Oncology | Admitting: Oncology

## 2023-07-13 ENCOUNTER — Inpatient Hospital Stay: Attending: Oncology

## 2023-07-13 ENCOUNTER — Other Ambulatory Visit: Payer: Self-pay | Admitting: Oncology

## 2023-07-13 DIAGNOSIS — Z7901 Long term (current) use of anticoagulants: Secondary | ICD-10-CM | POA: Insufficient documentation

## 2023-07-13 DIAGNOSIS — Z8541 Personal history of malignant neoplasm of cervix uteri: Secondary | ICD-10-CM | POA: Diagnosis not present

## 2023-07-13 DIAGNOSIS — D6851 Activated protein C resistance: Secondary | ICD-10-CM | POA: Insufficient documentation

## 2023-07-13 DIAGNOSIS — I82412 Acute embolism and thrombosis of left femoral vein: Secondary | ICD-10-CM | POA: Insufficient documentation

## 2023-07-13 MED ORDER — APIXABAN 5 MG PO TABS: 5.0000 mg | ORAL_TABLET | Freq: Two times a day (BID) | ORAL | 3 refills | Status: AC

## 2023-07-14 LAB — LUPUS ANTICOAGULANT
DRVVT: 74.6 s — ABNORMAL HIGH (ref 0.0–47.0)
PTT Lupus Anticoagulant: 29.8 s (ref 0.0–43.5)
Thrombin Time: 19.8 s (ref 0.0–23.0)
dPT Confirm Ratio: 1.27 ratio (ref 0.00–1.34)
dPT: 46.3 s (ref 0.0–47.6)

## 2023-07-14 LAB — PROTEIN S, TOTAL AND FREE
Protein S Ag, Free: 113 % (ref 61–136)
Protein S Ag, Total: 111 % (ref 60–150)

## 2023-07-14 LAB — CARDIOLIPIN ANTIBODIES, IGG, IGM, IGA
Anticardiolipin IgA: <9 U/mL (ref 0–11)
Anticardiolipin IgG: <9 GPL U/mL (ref 0–14)
Anticardiolipin IgM: <9 [MPL'U]/mL (ref 0–12)

## 2023-07-14 LAB — DRVVT CONFIRM: dRVVT Confirm: 1.3 ratio — ABNORMAL HIGH (ref 0.8–1.2)

## 2023-07-14 LAB — ANTITHROMBIN PANEL
AT III AG PPP IMM-ACNC: 133 % — ABNORMAL HIGH (ref 72–124)
Antithrombin Activity: 198 % — ABNORMAL HIGH (ref 75–135)

## 2023-07-14 LAB — PROTEIN C, TOTAL: Protein C, Total: 137 % (ref 60–150)

## 2023-07-14 LAB — DRVVT MIX: dRVVT Mix: 54.8 s — ABNORMAL HIGH (ref 0.0–40.4)

## 2023-07-14 LAB — PROTEIN C ACTIVITY: Protein C Activity: 174 % (ref 73–180)

## 2023-07-15 LAB — BETA-2-GLYCOPROTEIN I ABS, IGG/M/A
Beta-2 Glyco I IgG: <9 GPI IgG units (ref 0–20)
Beta-2-Glycoprotein I IgA: <9 GPI IgA units (ref 0–25)
Beta-2-Glycoprotein I IgM: <9 GPI IgM units (ref 0–32)

## 2023-07-19 LAB — FACTOR 5 LEIDEN

## 2023-07-20 LAB — PROTHROMBIN GENE MUTATION

## 2023-11-01 ENCOUNTER — Other Ambulatory Visit: Payer: Self-pay | Admitting: Nurse Practitioner

## 2023-11-01 DIAGNOSIS — R928 Other abnormal and inconclusive findings on diagnostic imaging of breast: Secondary | ICD-10-CM

## 2023-11-04 ENCOUNTER — Ambulatory Visit

## 2023-11-04 ENCOUNTER — Ambulatory Visit
Admission: RE | Admit: 2023-11-04 | Discharge: 2023-11-04 | Disposition: A | Discharge status: Home / Self Care | Source: Ambulatory Visit | Attending: Nurse Practitioner | Admitting: Nurse Practitioner

## 2023-11-04 ENCOUNTER — Other Ambulatory Visit: Payer: Self-pay | Admitting: Nurse Practitioner

## 2023-11-04 DIAGNOSIS — R928 Other abnormal and inconclusive findings on diagnostic imaging of breast: Secondary | ICD-10-CM

## 2023-11-04 MED ORDER — GADOPICLENOL 0.5 MMOL/ML IV SOLN
7.5000 mL | Freq: Once | INTRAVENOUS | Status: AC | PRN
  Administered 2023-11-04: 7.5 mL via INTRAVENOUS

## 2023-11-04 NOTE — Progress Notes (Deleted)
 [facility]  [address] [place],  [place]  [address] [phone]  Clinic Day:  05/28/2023  Referring physician: Dia Vina HERO, NP-C   HISTORY OF PRESENT ILLNESS:  The patient is a 63 y.o. female who I was asked to consult upon for a left great saphenous vein clot, with early extension into her femoral vein.  Initially, the patient had a CT scan of her abdomen/pelvis for her cervical cancer surveillance, which showed no evidence of disease recurrence.  However, an incidental DVT was seen in her left femoral vein.  This led to a Doppler ultrasound being done of her left leg, which showed clot in her greater saphenous vein, with extension into her femoral vein.  The patient recalls having mild discomfort just medial to her left knee.  However, she denies ever having any significant erythema or swelling in her left leg.  Of note, she had a pulmonary embolus which propagated from a DVT in the late 2000s.  The patient recalls being told she was homozygous for the factor V Leiden mutation.  Since her recent DVT was found, she has been taking Eliquis  5 mg BID.  She comes in today to discuss how to approach her clotting issues moving forward.    PAST MEDICAL HISTORY:   Past Medical History:  Diagnosis Date  . Anemia   . Anxiety   . ASCUS on Pap smear   . Cervical cancer (HCC) dx 04-06-2013  [facility]/  [facility] DR SHANNON FOR BRACHYTHERAPY (HIGH DOSE RADIATION VIA TANDEM RING)   STAGE IIB   T1b2, N1,  M0  invasive differentiated squamous cell carcinoma, FIGO  . Complication of anesthesia    pt has better pain control w dilaudid  than morphine   . Factor V Leiden mutation (HCC)   . Fatigue    SECONDARY TO CHEMORADIATION  . History of DVT (deep vein thrombosis)   . History of pulmonary embolus (PE)   . History of radiation therapy 05/11/13, 05/17/13, 05/22/13, 05/31/13, 06/07/13   27.5 gray to cervix  . Hypothyroidism    . MRSA (methicillin resistant staph aureus) culture positive    LEFT BUTTOCK AREA --  USING NEOSPORIN AND TAKING  ORAL ANTIBIOTICS  . Ovarian cyst   . S/P PICC central line placement     PAST SURGICAL HISTORY:   Past Surgical History:  Procedure Laterality Date  . CERVICAL BIOPSY  03/17/2013  . CERVICAL CONIZATION W/BX  2009  . TANDEM RING INSERTION N/A 05/11/2013   Procedure: EXAMINATION UNDER ANETHESIA, PLACEMENT OF GOLD MARKERS, PLACEMENT OF TANDEM RING FOR HIGH DOSE RADIATION THERAPY;  Surgeon: Lynwood JONETTA SHANNON, MD;  Location: [facility];  Service: Urology;  Laterality: N/A;  . TANDEM RING INSERTION N/A 05/17/2013   Procedure: TANDEM RING PLACEMENT;  Surgeon: Lynwood JONETTA SHANNON, MD;  Location: [facility];  Service: Urology;  Laterality: N/A;  . TANDEM RING INSERTION N/A 05/22/2013   Procedure: TANDEM RING PLACEMENT;  Surgeon: Lynwood JONETTA SHANNON, MD;  Location: [facility];  Service: Urology;  Laterality: N/A;  . TANDEM RING INSERTION N/A 05/31/2013   Procedure: TANDEM RING INSERTION;  Surgeon: Lynwood JONETTA SHANNON, MD;  Location: [facility];  Service: Urology;  Laterality: N/A;  . TANDEM RING INSERTION N/A 06/07/2013   Procedure: TANDEM RING INSERTION;  Surgeon: Lynwood JONETTA SHANNON, MD;  Location: [facility];  Service: Urology;  Laterality: N/A;    CURRENT  MEDICATIONS:   Current Outpatient Medications  Medication Sig Dispense Refill  . apixaban  (ELIQUIS ) 5 MG TABS tablet Take 1 tablet (5 mg total) by mouth 2 (two) times daily. 180 tablet 3  . atorvastatin (LIPITOR) 40 MG tablet Take 40 mg by mouth daily.    . cholecalciferol (VITAMIN D) 1000 UNITS tablet Take 1,000 Units by mouth daily.    . dapagliflozin propanediol (FARXIGA) 10 MG TABS tablet Take by mouth daily.    SABRA gabapentin (NEURONTIN) 800 MG tablet Take 800 mg by mouth 4 (four) times daily.    SABRA glipiZIDE (GLUCOTROL) 5 MG tablet Take by mouth daily before breakfast.     . hydrochlorothiazide (MICROZIDE) 12.5 MG capsule Take 12.5 mg by mouth daily.    SABRA levothyroxine (SYNTHROID) 112 MCG tablet Take 112 mcg by mouth daily.    SABRA PARoxetine (PAXIL) 10 MG tablet Take 10 mg by mouth daily.    . Tirzepatide (MOUNJARO [place]) Inject into the skin.    SABRA venlafaxine (EFFEXOR) 37.5 MG tablet Take 37.5 mg by mouth 2 (two) times daily.     No current facility-administered medications for this visit.    ALLERGIES:   Allergies  Allergen Reactions  . Morphine  And Codeine Nausea And Vomiting    DILAUDID  IS OK    FAMILY HISTORY:   Family History  Problem Relation Age of Onset  . Stroke Mother   . Hypertension Mother   . Pneumonia Father   . Diabetes Father   . Diabetes Brother     SOCIAL HISTORY:  The patient was born and raised in [place].  She lives in town.  She is single, with no children.  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  She denies having a history of alcoholism or tobacco abuse.    REVIEW OF SYSTEMS:  Review of Systems  Constitutional:  Negative for fatigue and fever.  HENT:   Negative for hearing loss and sore throat.   Eyes:  Negative for eye problems.  Respiratory:  Negative for chest tightness, cough and hemoptysis.   Cardiovascular:  Negative for chest pain and palpitations.  Gastrointestinal:  Negative for abdominal distention, abdominal pain, blood in stool, constipation, diarrhea, nausea and vomiting.  Endocrine: Negative for hot flashes.  Genitourinary:  Negative for difficulty urinating, dysuria, frequency, hematuria and nocturia.   Musculoskeletal:  Positive for gait problem. Negative for arthralgias, back pain and myalgias.  Skin: Negative.  Negative for itching and rash.  Neurological:  Positive for gait problem. Negative for dizziness, extremity weakness, headaches, light-headedness and numbness.  Hematological: Negative.   Psychiatric/Behavioral: Negative.  Negative for depression and suicidal ideas.  The patient is not nervous/anxious.      PHYSICAL EXAM:  There were no vitals taken for this visit. Wt Readings from Last 3 Encounters:  05/28/23 154 lb 8 oz (70.1 kg)  02/25/18 171 lb (77.6 kg)  04/02/16 141 lb 8 oz (64.2 kg)   There is no height or weight on file to calculate BMI. Performance status (ECOG): 1 - Symptomatic but completely ambulatory Physical Exam Constitutional:      Appearance: Normal appearance. She is not ill-appearing.  HENT:     Mouth/Throat:     Mouth: Mucous membranes are moist.     Pharynx: Oropharynx is clear. No oropharyngeal exudate or posterior oropharyngeal erythema.  Cardiovascular:     Rate and Rhythm: Normal rate and regular rhythm.     Heart sounds: No murmur heard.  No friction rub. No gallop.  Pulmonary:     Effort: Pulmonary effort is normal. No respiratory distress.     Breath sounds: Normal breath sounds. No wheezing, rhonchi or rales.  Abdominal:     General: Bowel sounds are normal. There is no distension.     Palpations: Abdomen is soft. There is no mass.     Tenderness: There is no abdominal tenderness.  Musculoskeletal:        General: No swelling.     Right lower leg: No edema.     Left lower leg: No edema.  Lymphadenopathy:     Cervical: No cervical adenopathy.     Upper Body:     Right upper body: No supraclavicular or axillary adenopathy.     Left upper body: No supraclavicular or axillary adenopathy.     Lower Body: No right inguinal adenopathy. No left inguinal adenopathy.  Skin:    General: Skin is warm.     Coloration: Skin is not jaundiced.     Findings: No lesion or rash.  Neurological:     General: No focal deficit present.     Mental Status: She is alert and oriented to person, place, and time. Mental status is at baseline.  Psychiatric:        Mood and Affect: Mood normal.        Behavior: Behavior normal.        Thought Content: Thought content normal.    LABS:      Latest Ref Rng & Units 08/03/2010     7:26 PM 10/30/2009    5:09 PM 07/07/2007    2:37 PM  CBC  WBC 4.0 - 10.5 K/uL 4.3  4.9    Hemoglobin 12.0 - 15.0 g/dL 86.4  87.9  86.0   Hematocrit 36.0 - 46.0 % 38.3  34.3  41.0   Platelets 150 - 400 K/uL 144  179        Latest Ref Rng & Units 04/11/2013    2:15 PM 10/30/2009    5:09 PM 07/07/2007    2:37 PM  CMP  Glucose 70 - 99 mg/dL  92  94   BUN 7.0 - 73.9 mg/dL 87.$MzfnczAzqnmzIZPI_VkairOGgrWJmqgjPZCoGzAgqeFiLaEbZ$$MzfnczAzqnmzIZPI_VkairOGgrWJmqgjPZCoGzAgqeFiLaEbZ$ Creatinine 0.6 - 1.1 mg/dL 0.8  9.25  0.9   Sodium 135 - 145 mEq/L  135  136   Potassium 3.5 - 5.1 mEq/L  4.3  4.3   Chloride 96 - 112 mEq/L  103  105   CO2 19 - 32 mEq/L  28    Calcium 8.4 - 10.5 mg/dL  9.1    Total Protein 6.0 - 8.3 g/dL  6.8    Total Bilirubin 0.3 - 1.2 mg/dL  0.6    Alkaline Phos 39 - 117 U/L  48    AST 0 - 37 U/L  15    ALT 0 - 35 U/L  13      Latest Reference Range & Units 07/13/23 14:06  Anticardiolipin Ab,IgA,Qn 0 - 11 APL U/mL <9  Anticardiolipin Ab,IgG,Qn 0 - 14 GPL U/mL <9  Anticardiolipin Ab,IgM,Qn 0 - 12 MPL U/mL <9  PTT Lupus Anticoagulant 0.0 - 43.5 sec 29.8  DRVVT 0.0 - 47.0 sec 74.6 (H)  Lupus Anticoag Interp  Result: c.*97G>A - Not Detected   Beta-2  Glycoprotein I Ab, IgG 0 - 20 GPI IgG units <9  Beta-2 -Glycoprotein I IgA 0 - 25 GPI IgA units <9  Beta-2 -Glycoprotein I IgM 0 - 32 GPI IgM units <  9  Antithrombin Activity 75 - 135 % 198 (H)  AT III AG PPP IMM-ACNC 72 - 124 % 133 (H)  Recommendations-F5LEID:  Result: 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   Recommendations-PTGENE:  Result: c.*97G>A - Not Detected   Protein C-Functional 73 - 180 % 174  Protein C, Total 60 - 150 % 137  Protein S, Free 61 - 136 % 113  Protein S, Total 60 - 150 % 111  (H): Data is abnormally high !: Data is abnormal  ASSESSMENT & PLAN:  A 63 y.o. female who I was asked to consult upon for a left great saphenous vein clot, with extension into her femoral vein.  The patient states  that in the late 2000s, she had a hypercoagulable workup which apparently showed her to be homozygous for the factor V Leiden mutation.  We are in the process of trying to collect this result.  If positive, she would be considered to have a  strong hypercoagulable disorder that would relegate her to lifelong anticoagulation.  We will try to ascertain those results from 15+ years ago.  Of note, the patient has undergone a pap smear/pelvic exam recently, which came back normal.  I do not believe her recent clot is due to an underlying malignancy.  I will see her back in 2 weeks; hopefully, by then, we can ascertain her hypercoagulable workup from years ago, which would be used to formulate her anticoagulation treatment plan moving forward.  The patient understands all the plans discussed today and is in agreement with them.  I do appreciate Dia Vina HERO, NP-C for his new consult.   Frankey Botting DELENA Kerns, MD

## 2023-11-05 ENCOUNTER — Ambulatory Visit: Admitting: Oncology

## 2023-11-05 ENCOUNTER — Inpatient Hospital Stay: Admitting: Oncology

## 2023-11-08 ENCOUNTER — Ambulatory Visit
Admission: RE | Admit: 2023-11-08 | Discharge: 2023-11-08 | Disposition: A | Discharge status: Home / Self Care | Source: Ambulatory Visit | Attending: Nurse Practitioner | Admitting: Nurse Practitioner

## 2023-11-08 ENCOUNTER — Other Ambulatory Visit: Payer: Self-pay | Admitting: Nurse Practitioner

## 2023-11-08 DIAGNOSIS — R928 Other abnormal and inconclusive findings on diagnostic imaging of breast: Secondary | ICD-10-CM

## 2023-11-11 ENCOUNTER — Inpatient Hospital Stay: Attending: Oncology | Admitting: Oncology

## 2023-11-11 ENCOUNTER — Other Ambulatory Visit: Payer: Self-pay | Admitting: Nurse Practitioner

## 2023-11-11 VITALS — BP 120/62 | HR 83 | Temp 97.7°F | Resp 16 | Ht 65.0 in | Wt 146.2 lb

## 2023-11-11 DIAGNOSIS — Z7901 Long term (current) use of anticoagulants: Secondary | ICD-10-CM | POA: Diagnosis not present

## 2023-11-11 DIAGNOSIS — Z86718 Personal history of other venous thrombosis and embolism: Secondary | ICD-10-CM | POA: Insufficient documentation

## 2023-11-11 DIAGNOSIS — D6851 Activated protein C resistance: Secondary | ICD-10-CM | POA: Insufficient documentation

## 2023-11-11 DIAGNOSIS — R599 Enlarged lymph nodes, unspecified: Secondary | ICD-10-CM

## 2023-11-11 DIAGNOSIS — Z86711 Personal history of pulmonary embolism: Secondary | ICD-10-CM | POA: Insufficient documentation

## 2023-11-14 DIAGNOSIS — D6851 Activated protein C resistance: Secondary | ICD-10-CM | POA: Insufficient documentation

## 2023-11-14 NOTE — Progress Notes (Signed)
 [facility]  [address] [place],  [place]  [address] [phone]  Clinic Day:  11/11/2023  Referring physician: Dia Vina HERO, NP-C   HISTORY OF PRESENT ILLNESS:  The patient is a 63 y.o. female who I recently began seeing again as scan showed a left great saphenous vein clot, with extension into her femoral vein.   Of note, she had a pulmonary embolus which propagated from a DVT in the late 2000s.  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  Since her recent DVT was found, she has been taking Eliquis  5 mg BID.  She comes in today to go over the results of her factor V Leiden mutation testing and the implications.  PHYSICAL EXAM:  Blood pressure 120/62, pulse 83, temperature 97.7 F (36.5 C), temperature source Oral, resp. rate 16, height 5' 5 (1.651 m), weight 146 lb 3.2 oz (66.3 kg), SpO2 98%. Wt Readings from Last 3 Encounters:  11/11/23 146 lb 3.2 oz (66.3 kg)  05/28/23 154 lb 8 oz (70.1 kg)  02/25/18 171 lb (77.6 kg)   Body mass index is 24.33 kg/m. Performance status (ECOG): 1 - Symptomatic but completely ambulatory Physical Exam Constitutional:      Appearance: Normal appearance. She is not ill-appearing.  HENT:     Mouth/Throat:     Mouth: Mucous membranes are moist.     Pharynx: Oropharynx is clear. No oropharyngeal exudate or posterior oropharyngeal erythema.  Cardiovascular:     Rate and Rhythm: Normal rate and regular rhythm.     Heart sounds: No murmur heard.    No friction rub. No gallop.  Pulmonary:     Effort: Pulmonary effort is normal. No respiratory distress.     Breath sounds: Normal breath sounds. No wheezing, rhonchi or rales.  Abdominal:     General: Bowel sounds are normal. There is no distension.     Palpations: Abdomen is soft. There is no mass.     Tenderness: There is no abdominal tenderness.  Musculoskeletal:        General: No swelling.     Right  lower leg: No edema.     Left lower leg: No edema.  Lymphadenopathy:     Cervical: No cervical adenopathy.     Upper Body:     Right upper body: No supraclavicular or axillary adenopathy.     Left upper body: No supraclavicular or axillary adenopathy.     Lower Body: No right inguinal adenopathy. No left inguinal adenopathy.  Skin:    General: Skin is warm.     Coloration: Skin is not jaundiced.     Findings: No lesion or rash.  Neurological:     General: No focal deficit present.     Mental Status: She is alert and oriented to person, place, and time. Mental status is at baseline.  Psychiatric:        Mood and Affect: Mood normal.        Behavior: Behavior normal.        Thought Content: Thought content normal.    LABS:      Latest Ref Rng & Units 08/03/2010    7:26 PM 10/30/2009    5:09 PM 07/07/2007    2:37 PM  CBC  WBC 4.0 - 10.5 K/uL 4.3  4.9    Hemoglobin 12.0 - 15.0 g/dL 86.4  87.9  86.0   Hematocrit 36.0 - 46.0 % 38.3  34.3  41.0   Platelets 150 - 400 K/uL 144  179        Latest Ref Rng & Units 04/11/2013    2:15 PM 10/30/2009    5:09 PM 07/07/2007    2:37 PM  CMP  Glucose 70 - 99 mg/dL  92  94   BUN 7.0 - 73.9 mg/dL 87.$[id number]$$MzfnczAzqnmzIZPI_[id number]$ Creatinine 0.6 - 1.1 mg/dL 0.8  9.25  0.9   Sodium 135 - 145 mEq/L  135  136   Potassium 3.5 - 5.1 mEq/L  4.3  4.3   Chloride 96 - 112 mEq/L  103  105   CO2 19 - 32 mEq/L  28    Calcium 8.4 - 10.5 mg/dL  9.1    Total Protein 6.0 - 8.3 g/dL  6.8    Total Bilirubin 0.3 - 1.2 mg/dL  0.6    Alkaline Phos 39 - 117 U/L  48    AST 0 - 37 U/L  15    ALT 0 - 35 U/L  13      Latest Reference Range & Units 07/13/23 14:06  Anticardiolipin Ab,IgA,Qn 0 - 11 APL U/mL <9  Anticardiolipin Ab,IgG,Qn 0 - 14 GPL U/mL <9  Anticardiolipin Ab,IgM,Qn 0 - 12 MPL U/mL <9  PTT Lupus Anticoagulant 0.0 - 43.5 sec 29.8  DRVVT 0.0 - 47.0 sec 74.6 (H)  Lupus Anticoag Interp  Result: c.*97G>A - Not Detected   Beta-2  Glycoprotein I Ab, IgG 0 - 20 GPI IgG units <9   Beta-2 -Glycoprotein I IgA 0 - 25 GPI IgA units <9  Beta-2 -Glycoprotein I IgM 0 - 32 GPI IgM units <9  Antithrombin Activity 75 - 135 % 198 (H)  AT III AG PPP IMM-ACNC 72 - 124 % 133 (H)  Recommendations-F5LEID:  Result: 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   Recommendations-PTGENE:  Result: c.*97G>A - Not Detected   Protein C-Functional 73 - 180 % 174  Protein C, Total 60 - 150 % 137  Protein S, Free 61 - 136 % 113  Protein S, Total 60 - 150 % 111  (H): Data is abnormally high !: Data is abnormal  ASSESSMENT & PLAN:  A 63 y.o. female with a left great saphenous vein clot, which extended into her femoral vein.  She also has remote history of a pulmonary embolus.  In clinic today, I went over all of her recent hypercoagulable labs with her, which clearly shows that she is homozygous for the factor V Leiden mutation.  This is a very strong hypercoagulable disorder to where it will relegate her to lifelong anticoagulation.  She knows to take Eliquis  5 mg twice daily indefinitely.  Outside of her clotting issues, the patient appears to be doing well.  As she has no other pressing hematologic issues, I will turn her care back over to her primary care office.  I would not have a problem seeing this patient back in the future if new hematologic or oncologic issues arise that require repeat clinical assessment.  The patient understands all the plans discussed today and is in agreement with them.  Graylee Arutyunyan DELENA Kerns, MD

## 2024-02-11 ENCOUNTER — Ambulatory Visit
Admission: RE | Admit: 2024-02-11 | Discharge: 2024-02-11 | Disposition: A | Discharge status: Home / Self Care | Source: Ambulatory Visit | Attending: Nurse Practitioner | Admitting: Nurse Practitioner

## 2024-02-11 ENCOUNTER — Other Ambulatory Visit

## 2024-02-11 DIAGNOSIS — R599 Enlarged lymph nodes, unspecified: Secondary | ICD-10-CM

## 2024-02-16 ENCOUNTER — Other Ambulatory Visit
